# Patient Record
Sex: Male | Born: 1950 | Race: White | Hispanic: No | Marital: Married | Smoking: Former smoker
Health system: Southern US, Community
[De-identification: ages and names within clinical notes are randomized; demographics above are authoritative.]

## PROBLEM LIST (undated history)

## (undated) DIAGNOSIS — E785 Hyperlipidemia, unspecified: Secondary | ICD-10-CM

## (undated) HISTORY — DX: Hyperlipidemia, unspecified: E78.5

---

## 2004-01-22 ENCOUNTER — Encounter: Admission: RE | Admit: 2004-01-22 | Discharge: 2004-01-22 | Payer: Self-pay | Admitting: Family Medicine

## 2004-01-28 ENCOUNTER — Encounter: Admission: RE | Admit: 2004-01-28 | Discharge: 2004-01-28 | Payer: Self-pay | Admitting: Family Medicine

## 2004-02-26 ENCOUNTER — Encounter: Admission: RE | Admit: 2004-02-26 | Discharge: 2004-02-26 | Payer: Self-pay | Admitting: Family Medicine

## 2004-05-12 ENCOUNTER — Encounter: Admission: RE | Admit: 2004-05-12 | Discharge: 2004-05-12 | Payer: Self-pay | Admitting: Sports Medicine

## 2005-03-27 ENCOUNTER — Ambulatory Visit: Payer: Self-pay | Admitting: Family Medicine

## 2005-03-30 ENCOUNTER — Ambulatory Visit: Payer: Self-pay | Admitting: Family Medicine

## 2006-05-18 ENCOUNTER — Ambulatory Visit: Payer: Self-pay | Admitting: Family Medicine

## 2006-05-30 ENCOUNTER — Ambulatory Visit: Payer: Self-pay | Admitting: Family Medicine

## 2006-11-22 DIAGNOSIS — E7800 Pure hypercholesterolemia, unspecified: Secondary | ICD-10-CM

## 2006-11-22 DIAGNOSIS — J309 Allergic rhinitis, unspecified: Secondary | ICD-10-CM | POA: Insufficient documentation

## 2006-11-22 DIAGNOSIS — E781 Pure hyperglyceridemia: Secondary | ICD-10-CM | POA: Insufficient documentation

## 2007-06-10 ENCOUNTER — Ambulatory Visit: Payer: Self-pay | Admitting: Family Medicine

## 2007-06-11 ENCOUNTER — Ambulatory Visit: Payer: Self-pay | Admitting: Family Medicine

## 2007-06-11 LAB — CONVERTED CEMR LAB
AST: 24 units/L (ref 0–37)
BUN: 16 mg/dL (ref 6–23)
CO2: 23 meq/L (ref 19–32)
Calcium: 9.8 mg/dL (ref 8.4–10.5)
Chloride: 102 meq/L (ref 96–112)
Cholesterol: 204 mg/dL — ABNORMAL HIGH (ref 0–200)
Creatinine, Ser: 1.38 mg/dL (ref 0.40–1.50)
HDL: 45 mg/dL (ref >39)
Total Bilirubin: 0.6 mg/dL (ref 0.3–1.2)
Total CHOL/HDL Ratio: 4.5
VLDL: 39 mg/dL (ref 0–40)

## 2007-06-17 ENCOUNTER — Encounter: Payer: Self-pay | Admitting: Family Medicine

## 2007-06-18 ENCOUNTER — Encounter: Payer: Self-pay | Admitting: Family Medicine

## 2007-06-18 LAB — CONVERTED CEMR LAB: OCCULT 3: NEGATIVE

## 2007-06-19 ENCOUNTER — Encounter: Payer: Self-pay | Admitting: Family Medicine

## 2007-06-19 LAB — CONVERTED CEMR LAB: OCCULT 2: NEGATIVE

## 2007-06-20 ENCOUNTER — Encounter: Payer: Self-pay | Admitting: Family Medicine

## 2008-06-29 ENCOUNTER — Ambulatory Visit: Payer: Self-pay | Admitting: Family Medicine

## 2008-07-07 ENCOUNTER — Ambulatory Visit: Payer: Self-pay | Admitting: Family Medicine

## 2008-07-07 ENCOUNTER — Encounter: Payer: Self-pay | Admitting: Family Medicine

## 2008-07-07 LAB — CONVERTED CEMR LAB
ALT: 19 units/L (ref 0–53)
AST: 19 units/L (ref 0–37)
Alkaline Phosphatase: 62 units/L (ref 39–117)
LDL Cholesterol: 104 mg/dL — ABNORMAL HIGH (ref 0–99)
PSA: 1.64 ng/mL (ref 0.10–4.00)
Sodium: 138 meq/L (ref 135–145)
Total Bilirubin: 0.7 mg/dL (ref 0.3–1.2)
Total Protein: 6.4 g/dL (ref 6.0–8.3)
Triglycerides: 228 mg/dL — ABNORMAL HIGH (ref <150)
VLDL: 46 mg/dL — ABNORMAL HIGH (ref 0–40)

## 2009-06-30 ENCOUNTER — Ambulatory Visit: Payer: Self-pay | Admitting: Family Medicine

## 2009-06-30 DIAGNOSIS — N529 Male erectile dysfunction, unspecified: Secondary | ICD-10-CM

## 2009-06-30 HISTORY — DX: Male erectile dysfunction, unspecified: N52.9

## 2009-07-09 ENCOUNTER — Ambulatory Visit: Payer: Self-pay | Admitting: Family Medicine

## 2009-07-09 ENCOUNTER — Encounter: Payer: Self-pay | Admitting: Family Medicine

## 2009-07-09 LAB — CONVERTED CEMR LAB
ALT: 18 units/L (ref 0–53)
AST: 18 units/L (ref 0–37)
Albumin: 4.5 g/dL (ref 3.5–5.2)
Alkaline Phosphatase: 56 units/L (ref 39–117)
BUN: 12 mg/dL (ref 6–23)
HDL: 40 mg/dL (ref >39)
LDL Cholesterol: 116 mg/dL — ABNORMAL HIGH (ref 0–99)
PSA: 1.16 ng/mL (ref 0.10–4.00)
Potassium: 4.2 meq/L (ref 3.5–5.3)
Sodium: 141 meq/L (ref 135–145)

## 2009-07-13 ENCOUNTER — Encounter: Payer: Self-pay | Admitting: Family Medicine

## 2009-07-16 LAB — CONVERTED CEMR LAB: OCCULT 3: NEGATIVE

## 2010-05-03 ENCOUNTER — Encounter: Payer: Self-pay | Admitting: Family Medicine

## 2010-10-25 NOTE — Miscellaneous (Signed)
Summary: Proof of Physical  Physical form dropped off to be filled out.   Please call patient when ready to be picked up. Jonathan Dean  May 03, 2010 4:59 PM form to pcp.Golden Circle RN  May 04, 2010 9:16 AM  Signed and given to Jonathan Dean, triage nurse.  Doralee Albino MD  May 04, 2010 1:53 PM  pt notified & he will pcik up.Golden Circle RN  May 04, 2010 4:29 PM

## 2010-10-27 ENCOUNTER — Encounter: Payer: Self-pay | Admitting: *Deleted

## 2011-09-15 ENCOUNTER — Encounter: Payer: Self-pay | Admitting: Family Medicine

## 2011-09-15 ENCOUNTER — Ambulatory Visit (INDEPENDENT_AMBULATORY_CARE_PROVIDER_SITE_OTHER): Payer: 59 | Admitting: Family Medicine

## 2011-09-15 VITALS — BP 119/75 | HR 69 | Temp 97.7°F | Ht 70.0 in | Wt 174.0 lb

## 2011-09-15 DIAGNOSIS — Z1211 Encounter for screening for malignant neoplasm of colon: Secondary | ICD-10-CM

## 2011-09-15 DIAGNOSIS — D126 Benign neoplasm of colon, unspecified: Secondary | ICD-10-CM

## 2011-09-15 DIAGNOSIS — E7800 Pure hypercholesterolemia, unspecified: Secondary | ICD-10-CM

## 2011-09-15 HISTORY — DX: Benign neoplasm of colon, unspecified: D12.6

## 2011-09-15 LAB — LIPID PANEL
Cholesterol: 253 mg/dL — ABNORMAL HIGH (ref 0–200)
LDL Cholesterol: 181 mg/dL — ABNORMAL HIGH (ref 0–99)
VLDL: 23 mg/dL (ref 0–40)

## 2011-09-15 MED ORDER — ZOSTER VACCINE LIVE 19400 UNT/0.65ML ~~LOC~~ SOLR: 0.6500 mL | Freq: Once | SUBCUTANEOUS | Status: AC

## 2011-09-15 NOTE — Patient Instructions (Addendum)
I strongly recommend colon cancer screening.  My nurse will set you up with a GI referral for colonoscopy. At age 60 you are now recommended the Shingles vaccine (zostavax).  It is expensive $250.  I sent a prescription to the pharmacy.  They can let you know if insurance covers.  Let me know if you receive the vaccine so I can update my records. I will call with results of your blood work and send letter.  I will likely restart your generic lipitor once I have those results.

## 2011-09-15 NOTE — Progress Notes (Signed)
  Subjective:    Patient ID: Jonathan Dean, male    DOB: 1950/12/12, 60 y.o.   MRN: [id number]  HPI Very healthy.  Good health habits with diet and exercise.  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      Review of Systems  Denies CP, SOB, wt change, bleeding     Objective:   Physical Exam HEENT nl Neck supple Lungs clear Cardiac RRR without m Abd benign. Ext no edema        Assessment & Plan:

## 2011-09-15 NOTE — Assessment & Plan Note (Addendum)
Need labs  LDL up, will resume lipitor

## 2011-09-16 LAB — COMPLETE METABOLIC PANEL WITH GFR
ALT: 20 U/L (ref 0–53)
AST: 17 U/L (ref 0–37)
Albumin: 4.3 g/dL (ref 3.5–5.2)
Calcium: 9.5 mg/dL (ref 8.4–10.5)
Chloride: 105 mEq/L (ref 96–112)
Potassium: 4.5 mEq/L (ref 3.5–5.3)

## 2011-09-20 ENCOUNTER — Encounter: Payer: Self-pay | Admitting: Family Medicine

## 2011-09-20 MED ORDER — ATORVASTATIN CALCIUM 20 MG PO TABS: 20.0000 mg | ORAL_TABLET | ORAL | Status: DC

## 2011-09-20 NOTE — Progress Notes (Signed)
Addended by: Tivis Ringer on: 09/20/2011 10:50 AM   Modules accepted: Orders

## 2011-10-03 ENCOUNTER — Telehealth: Payer: Self-pay | Admitting: *Deleted

## 2011-10-03 ENCOUNTER — Encounter: Payer: Self-pay | Admitting: Gastroenterology

## 2011-10-03 NOTE — Telephone Encounter (Signed)
Called and spoke with pt's wife Larita Fife) gave her the following information regarding his appt times and dates 1.  Jan. 29, 2013 @ 1000 am (this is a nurse visit) 2.  Feb. 12, 2013 @ 800 am  (procedure, Dr. Christella Hartigan)  These appts are with [facility] [address] 3rd floor ph. [phone].Loralee Pacas [place]

## 2011-10-24 ENCOUNTER — Ambulatory Visit (AMBULATORY_SURGERY_CENTER): Payer: No Typology Code available for payment source | Admitting: *Deleted

## 2011-10-24 VITALS — Ht 70.0 in | Wt 170.0 lb

## 2011-10-24 DIAGNOSIS — Z1211 Encounter for screening for malignant neoplasm of colon: Secondary | ICD-10-CM

## 2011-10-24 MED ORDER — PEG-KCL-NACL-NASULF-NA ASC-C 100 G PO SOLR: ORAL | Status: DC

## 2011-11-06 NOTE — Progress Notes (Signed)
Addended by: Maple Hudson on: 11/06/2011 12:29 PM   Modules accepted: Level of Service

## 2011-11-07 ENCOUNTER — Ambulatory Visit (AMBULATORY_SURGERY_CENTER): Payer: No Typology Code available for payment source | Admitting: Gastroenterology

## 2011-11-07 ENCOUNTER — Encounter: Payer: Self-pay | Admitting: Gastroenterology

## 2011-11-07 VITALS — BP 114/74 | HR 63 | Temp 97.1°F | Resp 15 | Ht 70.0 in | Wt 170.0 lb

## 2011-11-07 DIAGNOSIS — Z1211 Encounter for screening for malignant neoplasm of colon: Secondary | ICD-10-CM

## 2011-11-07 DIAGNOSIS — D126 Benign neoplasm of colon, unspecified: Secondary | ICD-10-CM

## 2011-11-07 MED ORDER — SODIUM CHLORIDE 0.9 % IV SOLN: 500.0000 mL | INTRAVENOUS | Status: DC

## 2011-11-07 NOTE — Progress Notes (Signed)
Patient did not experience any of the following events: a burn prior to discharge; a fall within the facility; wrong site/side/patient/procedure/implant event; or a hospital transfer or hospital admission upon discharge from the facility. (G8907) Patient did not have preoperative order for IV antibiotic SSI prophylaxis. (G8918)  

## 2011-11-07 NOTE — Patient Instructions (Signed)
Resume medications. Information given on polyps. D/C Information reviewed with family.

## 2011-11-07 NOTE — Op Note (Signed)
[facility] [address] [place], [place]  [address]  COLONOSCOPY PROCEDURE REPORT  PATIENT:  Jonathan Dean, Jonathan Dean  MR#:  [id number] BIRTHDATE:  Jul 10, 1951, 60 yrs. old  GENDER:  male ENDOSCOPIST:  Rachael Fee, MD REF. BY:  Doralee Albino, M.D. PROCEDURE DATE:  11/07/2011 PROCEDURE:  Colonoscopy with snare polypectomy ASA CLASS:  Class II INDICATIONS:  Routine Risk Screening MEDICATIONS:   Fentanyl 75 mcg IV, These medications were titrated to patient response per physician's verbal order, Versed 7 mg IV  DESCRIPTION OF PROCEDURE:   After the risks benefits and alternatives of the procedure were thoroughly explained, informed consent was obtained.  Digital rectal exam was performed and revealed no rectal masses.   The [id number] endoscope was introduced through the anus and advanced to the cecum, which was identified by both the appendix and ileocecal valve, without limitations.  The quality of the prep was good..  The instrument was then slowly withdrawn as the colon was fully examined. <<PROCEDUREIMAGES>> FINDINGS: Five polyps were removed. The largest was 1cm across, broad based, located in sigmoid segment, removed with snare/cautery and sent to pathology (jar 2). The other four were sessile, 2-6mm across, located in transverse, descending and sigmoid segments, all removed with cold snare, three of them were retrieved and sent to pathology (jar 1) (see image3, image5, image6, and image7).  This was otherwise a normal examination of the colon (see image1, image2, and image8).   Retroflexed views in the rectum revealed no abnormalities. completed. COMPLICATIONS:  None  ENDOSCOPIC IMPRESSION: 1) Five polyps found, all were removed, four were retrieved and sent to pathology 2) Otherwise normal examination  RECOMMENDATIONS: 1) If the polyp(s) removed today are proven to be adenomatous (pre-cancerous) polyps, you will need a colonoscopy in 3 years. Otherwise  you should continue to follow colorectal cancer screening guidelines for "routine risk" patients with a colonoscopy in 10 years. 2) You will receive a letter within 1-2 weeks with the results of your biopsy as well as final recommendations. Please call my office if you have not received a letter after 3 weeks.  ______________________________ Rachael Fee, MD  n. eSIGNED:   Rachael Fee at 11/07/2011 09:28 AM  Pearson Grippe, [phone]

## 2011-11-08 ENCOUNTER — Telehealth: Payer: Self-pay | Admitting: *Deleted

## 2011-11-08 NOTE — Telephone Encounter (Signed)
  Follow up Call-  Call back number 11/07/2011  Post procedure Call Back phone  # [phone]-  Permission to leave phone message Yes     Patient questions:  Do you have a fever, pain , or abdominal swelling? no Pain Score  0 *  Have you tolerated food without any problems? yes  Have you been able to return to your normal activities? yes  Do you have any questions about your discharge instructions: Diet   no Medications  no Follow up visit  no  Do you have questions or concerns about your Care? no  Actions: * If pain score is 4 or above: No action needed, pain <4.

## 2011-11-13 ENCOUNTER — Encounter: Payer: Self-pay | Admitting: Family Medicine

## 2011-11-14 ENCOUNTER — Encounter: Payer: Self-pay | Admitting: Gastroenterology

## 2012-10-04 ENCOUNTER — Encounter: Payer: Self-pay | Admitting: Family Medicine

## 2012-10-04 ENCOUNTER — Ambulatory Visit (INDEPENDENT_AMBULATORY_CARE_PROVIDER_SITE_OTHER): Payer: BC Managed Care – PPO | Admitting: Family Medicine

## 2012-10-04 VITALS — BP 112/65 | HR 60 | Ht 69.5 in | Wt 170.0 lb

## 2012-10-04 DIAGNOSIS — Z125 Encounter for screening for malignant neoplasm of prostate: Secondary | ICD-10-CM | POA: Insufficient documentation

## 2012-10-04 DIAGNOSIS — Z23 Encounter for immunization: Secondary | ICD-10-CM

## 2012-10-04 DIAGNOSIS — E781 Pure hyperglyceridemia: Secondary | ICD-10-CM

## 2012-10-04 DIAGNOSIS — Z0000 Encounter for general adult medical examination without abnormal findings: Secondary | ICD-10-CM

## 2012-10-04 DIAGNOSIS — E7800 Pure hypercholesterolemia, unspecified: Secondary | ICD-10-CM

## 2012-10-04 DIAGNOSIS — H409 Unspecified glaucoma: Secondary | ICD-10-CM | POA: Insufficient documentation

## 2012-10-04 DIAGNOSIS — Z136 Encounter for screening for cardiovascular disorders: Secondary | ICD-10-CM

## 2012-10-04 LAB — LIPID PANEL
Cholesterol: 175 mg/dL (ref 0–200)
LDL Cholesterol: 110 mg/dL — ABNORMAL HIGH (ref 0–99)
VLDL: 23 mg/dL (ref 0–40)

## 2012-10-04 LAB — PSA: PSA: 1.86 ng/mL (ref <=4.00)

## 2012-10-04 MED ORDER — ZOSTER VACCINE LIVE 19400 UNT/0.65ML ~~LOC~~ SOLR: 0.6500 mL | Freq: Once | SUBCUTANEOUS | Status: DC

## 2012-10-04 NOTE — Assessment & Plan Note (Signed)
Fasting today, will check labs.

## 2012-10-04 NOTE — Assessment & Plan Note (Signed)
Fasting today, will check labs. 

## 2012-10-04 NOTE — Progress Notes (Signed)
  Subjective:    Patient ID: Jonathan Dean, male    DOB: June 28, 1951, 62 y.o.   MRN: [id number]  HPI  For annual physical.  Feels great.  No complaints.  Is on eye drops for glaucoma but not certain which ones so my list my not be accurate.  Advised by ophthalmology to avoid nasonex because can cause or worsen glaucoma.    Due for lipid panel.  Has had nothing but black coffee this morning.  Last CMP showed creat creeping up, needs recheck.  No flu shot yet but wants one. Has not had shingles vaccine.  Will order.  Also is a former smoker and so is a candidate for AAA screening.    Review of Systems Denies CP, SOB, wt change, bleeding, change in bowel or bladder.     Objective:   Physical ExamHEENT nl Neck supple without masses Lungs clear Cardiac RRR without m or g Abd benign Ext no edema Neuro motor and sensory grossly intact.        Assessment & Plan:

## 2012-10-04 NOTE — Assessment & Plan Note (Signed)
Discussed PSA screen.  Since under 65 and very healthy, will screen again.  No risk factors.

## 2012-10-04 NOTE — Patient Instructions (Addendum)
I sent a prescription for the shingles vaccine to the pharmacy I will call with cholesterol results and other tests.  I will wait to refill your atorvasatin until we see the test results.   Keep up the good work.    You have been scheduled for an appointment for Aortic ultrasound at [facility] on Friday- 10/18/12 at 7:30 am, please arrive at Radiology on the 1st floor at 7:15am that morning.

## 2012-10-05 LAB — COMPLETE METABOLIC PANEL WITH GFR
ALT: 16 U/L (ref 0–53)
AST: 15 U/L (ref 0–37)
Albumin: 4.4 g/dL (ref 3.5–5.2)
Alkaline Phosphatase: 57 U/L (ref 39–117)
Calcium: 9.7 mg/dL (ref 8.4–10.5)
Chloride: 104 mEq/L (ref 96–112)
Potassium: 4.2 mEq/L (ref 3.5–5.3)
Sodium: 138 mEq/L (ref 135–145)
Total Protein: 6.6 g/dL (ref 6.0–8.3)

## 2012-10-10 ENCOUNTER — Encounter: Payer: Self-pay | Admitting: Family Medicine

## 2012-10-16 ENCOUNTER — Telehealth: Payer: Self-pay | Admitting: Family Medicine

## 2012-10-16 NOTE — Telephone Encounter (Signed)
Has a procedure this Friday and needs to discuss this with nurse first - pls call today

## 2012-10-18 ENCOUNTER — Ambulatory Visit (HOSPITAL_COMMUNITY): Payer: BC Managed Care – PPO

## 2013-01-27 ENCOUNTER — Other Ambulatory Visit: Payer: Self-pay | Admitting: *Deleted

## 2013-01-27 DIAGNOSIS — E7800 Pure hypercholesterolemia, unspecified: Secondary | ICD-10-CM

## 2013-01-27 MED ORDER — ATORVASTATIN CALCIUM 20 MG PO TABS: 20.0000 mg | ORAL_TABLET | ORAL | Status: DC

## 2013-03-20 ENCOUNTER — Other Ambulatory Visit: Payer: Self-pay | Admitting: *Deleted

## 2013-03-20 DIAGNOSIS — E7800 Pure hypercholesterolemia, unspecified: Secondary | ICD-10-CM

## 2013-03-20 MED ORDER — ATORVASTATIN CALCIUM 20 MG PO TABS: 20.0000 mg | ORAL_TABLET | ORAL | Status: DC

## 2014-04-24 ENCOUNTER — Encounter: Payer: BC Managed Care – PPO | Admitting: Family Medicine

## 2014-11-13 ENCOUNTER — Encounter: Payer: Self-pay | Admitting: Gastroenterology

## 2015-03-14 DIAGNOSIS — H401113 Primary open-angle glaucoma, right eye, severe stage: Secondary | ICD-10-CM | POA: Insufficient documentation

## 2015-03-16 ENCOUNTER — Telehealth: Payer: Self-pay | Admitting: Family Medicine

## 2015-03-16 DIAGNOSIS — H409 Unspecified glaucoma: Secondary | ICD-10-CM

## 2015-03-16 NOTE — Telephone Encounter (Signed)
Needs referral to dr j. Ruby Cola  bond at [facility] Was seen there on 02-23-15 and has another appt 05-19-15 He is seen there for his glaucoma Phone: [phone] ( wife didn't have all the information needed)

## 2015-03-16 NOTE — Telephone Encounter (Signed)
Dr. Edilia Bo is an ophthalmologist in [place] . Jonathan Dean, Jonathan Dean

## 2015-03-16 NOTE — Telephone Encounter (Signed)
Pt has not been in our office in almost 3 years. We do not have updated insurance info on file. LMOVM to ask patient about what type of insurance he had to determine if he indeed needs a referral.

## 2020-07-19 ENCOUNTER — Ambulatory Visit: Payer: Self-pay | Admitting: Internal Medicine

## 2020-07-20 ENCOUNTER — Ambulatory Visit: Payer: Self-pay | Admitting: Internal Medicine

## 2020-07-26 ENCOUNTER — Encounter (INDEPENDENT_AMBULATORY_CARE_PROVIDER_SITE_OTHER): Payer: Self-pay | Admitting: *Deleted

## 2020-07-26 ENCOUNTER — Other Ambulatory Visit: Payer: Self-pay

## 2020-07-26 ENCOUNTER — Encounter: Payer: Self-pay | Admitting: Internal Medicine

## 2020-07-26 ENCOUNTER — Ambulatory Visit (INDEPENDENT_AMBULATORY_CARE_PROVIDER_SITE_OTHER): Payer: No Typology Code available for payment source | Admitting: Internal Medicine

## 2020-07-26 VITALS — BP 137/83 | HR 70 | Ht 70.0 in | Wt 179.0 lb

## 2020-07-26 DIAGNOSIS — H401130 Primary open-angle glaucoma, bilateral, stage unspecified: Secondary | ICD-10-CM | POA: Diagnosis not present

## 2020-07-26 DIAGNOSIS — Z125 Encounter for screening for malignant neoplasm of prostate: Secondary | ICD-10-CM | POA: Diagnosis not present

## 2020-07-26 DIAGNOSIS — Z7689 Persons encountering health services in other specified circumstances: Secondary | ICD-10-CM | POA: Diagnosis not present

## 2020-07-26 DIAGNOSIS — D126 Benign neoplasm of colon, unspecified: Secondary | ICD-10-CM | POA: Diagnosis not present

## 2020-07-26 DIAGNOSIS — Z23 Encounter for immunization: Secondary | ICD-10-CM | POA: Diagnosis not present

## 2020-07-26 NOTE — Assessment & Plan Note (Signed)
Removed in 2013 Has not had follow up with GI No active complaints like abdominal pain, constipation, diarrhea, melena or hematochezia Referred to GI for follow-up

## 2020-07-26 NOTE — Assessment & Plan Note (Signed)
Follows up with ophthalmologist On Cosopt eye drops

## 2020-07-26 NOTE — Patient Instructions (Addendum)
Please schedule an appointment after 4 months for annual physical.  You were given Pneumonia vaccine (PCV12) in the office. Second dose after 1 year.  Please continue to follow heart healthy diet - consisting of green vegetables and fruits.  Please perform moderate exercise/walking at least 150 mins/week.  You are being referred to GI for screening colonoscopy.

## 2020-07-26 NOTE — Progress Notes (Signed)
New Patient Office Visit  Subjective:  Patient ID: Jonathan Dean, male    DOB: [birth year]  Age: 69 y.o. MRN: [id number]  CC:  Chief Complaint  Patient presents with   New Patient (Initial Visit)    here to establish care, no complaints today    HPI Jonathan Dean is a 69 year old male with past medical history of hyperlipidemia, glaucoma and colon polyps presents for establishing care.  Patient states that he has been in good health overall.  Patient uses eyedrops for his glaucoma and follows up with ophthalmologist, last visit at this morning.  Patient was taking Lipitor for hyperlipidemia in the past.  But it was discontinued after improvement in his lipid profile.  Patient denies chest pain, dyspnea or palpitations.  Patient had last colonoscopy in 10/2011, which showed tubular adenoma and tubulovillous polyp.  Patient has not had followed up with GI since then.  Patient denies constipation, diarrhea, melena or hematochezia.  Patient quit smoking in 2006.  Patient has not had ultrasound of the abdomen for AAA screening, patient wants to wait for now.  Patient has had both doses of cold vaccination and flu vaccine.  Patient received PCV13 in the office today.  Patient was advised to take shingles vaccine from the pharmacy.  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      History reviewed. No pertinent surgical history.  History reviewed. No pertinent family history.  Social History   Socioeconomic History   Marital status: Married    Spouse name: Not on file   Number of children: Not on file   Years of education: Not on file   Highest education level: Not on file  Occupational History   Not on file  Tobacco Use    Smoking status: Former Smoker    Years: 25.00    Types: Cigarettes    Quit date: 09/14/2005    Years since quitting: 14.8   Smokeless tobacco: Never Used  Substance and Sexual Activity   Alcohol use: Yes    Alcohol/week: 2.0 standard drinks    Types: 2 drink(s) per week   Drug use: No   Sexual activity: Yes    Partners: Female    Comment: monogamous  Other Topics Concern   Not on file  Social History Narrative   Not on file   Social Determinants of Health   Financial Resource Strain:    Difficulty of Paying Living Expenses: Not on file  Food Insecurity:    Worried About Charity fundraiser in the Last Year: Not on file   [employer] of Food in the Last Year: Not on file  Transportation Needs:    Lack of Transportation (Medical): Not on file   Lack of Transportation (Non-Medical): Not on file  Physical Activity:    Days of Exercise per Week: Not on file   Minutes of Exercise per Session: Not on file  Stress:    Feeling of Stress : Not on file  Social Connections:    Frequency of Communication with Friends and Family: Not on file   Frequency of Social Gatherings with Friends and Family: Not on file   Attends Religious Services: Not on file   Active Member of Clubs  or Organizations: Not on file   Attends Archivist Meetings: Not on file   Marital Status: Not on file  Intimate Partner Violence:    Fear of Current or Ex-Partner: Not on file   Emotionally Abused: Not on file   Physically Abused: Not on file   Sexually Abused: Not on file    ROS Review of Systems  Constitutional: Negative for chills and fever.  HENT: Negative for congestion and sore throat.   Eyes: Negative for pain and discharge.  Respiratory: Negative for cough and shortness of breath.   Cardiovascular: Negative for chest pain and palpitations.  Gastrointestinal: Negative for constipation, diarrhea, nausea and vomiting.  Endocrine: Negative for polydipsia and  polyuria.  Genitourinary: Negative for dysuria and hematuria.  Musculoskeletal: Negative for neck pain and neck stiffness.  Skin: Negative for rash.  Neurological: Negative for dizziness, weakness, numbness and headaches.  Psychiatric/Behavioral: Negative for agitation and behavioral problems.    Objective:   Today's Vitals: BP 137/83 (BP Location: Right Arm, Patient Position: Sitting, Cuff Size: Normal)    Pulse 70    Ht _0  (1.778 m)    Wt 179 lb (81.2 kg)    SpO2 98%    BMI 25.68 kg/m   Physical Exam Vitals reviewed.  Constitutional:      General: He is not in acute distress.    Appearance: He is not diaphoretic.  HENT:     Head: Normocephalic and atraumatic.     Nose: Nose normal.     Mouth/Throat:     Mouth: Mucous membranes are moist.  Eyes:     General: No scleral icterus.    Extraocular Movements: Extraocular movements intact.     Pupils: Pupils are equal, round, and reactive to light.  Cardiovascular:     Rate and Rhythm: Normal rate and regular rhythm.     Pulses: Normal pulses.     Heart sounds: Normal heart sounds. No murmur heard.   Pulmonary:     Breath sounds: Normal breath sounds. No wheezing or rales.  Abdominal:     Palpations: Abdomen is soft.     Tenderness: There is no abdominal tenderness.  Musculoskeletal:     Cervical back: Neck supple. No tenderness.     Right lower leg: No edema.     Left lower leg: No edema.  Skin:    General: Skin is warm.     Comments: Deep pigmented patches over dorsal aspect of bilateral hands and also on anterior aspect of the legs  Neurological:     General: No focal deficit present.     Mental Status: He is alert and oriented to person, place, and time.     Sensory: No sensory deficit.     Motor: No weakness.  Psychiatric:        Mood and Affect: Mood normal.        Behavior: Behavior normal.     Assessment & Plan:   Problem List Items Addressed This Visit      Encounter to establish care - Primary    Care established Previous chart reviewed History and medications reviewed with the patient     Relevant Orders  CBC with Differential  CMP14+EGFR  Lipid Profile  Vitamin D (25 hydroxy)    Digestive   Tubular adenoma of colon   Tubulovillous adenoma polyp of colon    Removed in 2013 Has not had follow up with GI No active complaints like abdominal pain, constipation, diarrhea, melena or  hematochezia Referred to GI for follow-up      Relevant Orders   Ambulatory referral to Gastroenterology     Other   Glaucoma    Follows up with ophthalmologist On Cosopt eye drops      Prostate cancer screening    No active complaints.  PSA screening discussed -PSA ordered.      Relevant Orders   PSA      Outpatient Encounter Medications as of 07/26/2020  Medication Sig   dorzolamide-timolol (COSOPT) 22.3-6.8 MG/ML ophthalmic solution    latanoprost (XALATAN) 0.005 % ophthalmic solution    Multiple Vitamins-Minerals (MULTIVITAMIN WITH MINERALS) tablet Take 1 tablet by mouth as needed.   [DISCONTINUED] Ascorbic Acid (VITAMIN C) 1000 MG tablet Take 1,000 mg by mouth daily. (Patient not taking: Reported on 07/26/2020)   [DISCONTINUED] aspirin 325 MG tablet Take 325 mg by mouth daily.   (Patient not taking: Reported on 07/26/2020)   [DISCONTINUED] atorvastatin (LIPITOR) 20 MG tablet Take 1 tablet (20 mg total) by mouth as directed. (Patient not taking: Reported on 07/26/2020)   [DISCONTINUED] B Complex CAPS Take 1 capsule by mouth daily. (Patient not taking: Reported on 07/26/2020)   [DISCONTINUED] LOTEMAX 0.5 % GEL  (Patient not taking: Reported on 07/26/2020)   [DISCONTINUED] LUMIGAN 0.01 % SOLN  (Patient not taking: Reported on 07/26/2020)   [DISCONTINUED] Potassium 99 MG TABS Take 1 tablet by mouth daily. (Patient not taking: Reported on 07/26/2020)   [DISCONTINUED] timolol (BETIMOL) 0.25 % ophthalmic solution Place 1-2 drops into both eyes 2 (two) times daily. (Patient not  taking: Reported on 07/26/2020)   [DISCONTINUED] vitamin E 400 UNIT capsule Take 400 Units by mouth daily. (Patient not taking: Reported on 07/26/2020)   [DISCONTINUED] zoster vaccine live, PF, (ZOSTAVAX) 19471 UNT/0.65ML injection Inject 19,400 Units into the skin once. (Patient not taking: Reported on 07/26/2020)   No facility-administered encounter medications on file as of 07/26/2020.    Follow-up: Return in about 4 months (around 11/23/2020).   Lindell Spar, MD

## 2020-07-26 NOTE — Assessment & Plan Note (Signed)
No active complaints.  PSA screening discussed -PSA ordered.

## 2020-07-26 NOTE — Addendum Note (Signed)
Addended by: Laretta Bolster on: 07/26/2020 02:10 PM   Modules accepted: Orders

## 2020-07-26 NOTE — Assessment & Plan Note (Signed)
Care established Previous chart reviewed History and medications reviewed with the patient 

## 2020-09-14 ENCOUNTER — Other Ambulatory Visit: Payer: Self-pay

## 2020-09-14 ENCOUNTER — Other Ambulatory Visit: Payer: No Typology Code available for payment source

## 2020-09-14 DIAGNOSIS — Z20822 Contact with and (suspected) exposure to covid-19: Secondary | ICD-10-CM

## 2020-09-16 LAB — NOVEL CORONAVIRUS, NAA: SARS-CoV-2, NAA: NOT DETECTED

## 2020-09-16 LAB — SARS-COV-2, NAA 2 DAY TAT

## 2020-09-16 LAB — SPECIMEN STATUS REPORT

## 2020-09-20 ENCOUNTER — Ambulatory Visit: Payer: No Typology Code available for payment source | Admitting: Nurse Practitioner

## 2020-09-20 ENCOUNTER — Encounter: Payer: Self-pay | Admitting: Nurse Practitioner

## 2020-09-20 ENCOUNTER — Other Ambulatory Visit: Payer: Self-pay

## 2020-09-20 VITALS — BP 156/94 | HR 71 | Temp 98.6°F | Resp 18 | Ht 70.0 in | Wt 167.0 lb

## 2020-09-20 DIAGNOSIS — J40 Bronchitis, not specified as acute or chronic: Secondary | ICD-10-CM | POA: Diagnosis not present

## 2020-09-20 MED ORDER — AMOXICILLIN-POT CLAVULANATE 875-125 MG PO TABS: 1.0000 | ORAL_TABLET | Freq: Two times a day (BID) | ORAL | 0 refills | Status: DC

## 2020-09-20 MED ORDER — ALBUTEROL SULFATE HFA 108 (90 BASE) MCG/ACT IN AERS: 2.0000 | INHALATION_SPRAY | Freq: Four times a day (QID) | RESPIRATORY_TRACT | 0 refills | Status: DC | PRN

## 2020-09-20 NOTE — Assessment & Plan Note (Signed)
-  feels SOB with exertion and has been ongoing for a week -Rx. Albuterol -Rx. augmentin -f/u in a week if symptoms persist

## 2020-09-20 NOTE — Progress Notes (Signed)
Acute Office Visit  Subjective:    Patient ID: Jonathan Dean, male    DOB: January 11, 1951, 69 y.o.   MRN: [id number]  Chief Complaint  Patient presents with  . Cough    X 1 week  . Shortness of Breath    X 1 week   . Fatigue    Weakness     HPI Patient is in today for SOB, productive cough, and fatigue for about a week.  He states he has SOB with exertion. Denies fever, sinus pain/pressure, He had COVID test last Tuesday, and this was negative. He has been using ibuprofen and mucinex and Vicks vaporub.  Past Medical History:  Diagnosis Date  . ERECTILE DYSFUNCTION 06/30/2009   Qualifier: Diagnosis of  By: Andria Frames MD, Gwyndolyn Saxon    . Glaucoma   . Hyperlipidemia   . Polyp of colon, adenomatous 09/15/2011   Colonoscopy 11/07/11 removed at least two adenomatous polyps confirmed by surgical path.      No past surgical history on file.  No family history on file.  Social History   Socioeconomic History  . Marital status: Married    Spouse name: Not on file  . Number of children: Not on file  . Years of education: Not on file  . Highest education level: Not on file  Occupational History  . Not on file  Tobacco Use  . Smoking status: Former Smoker    Years: 25.00    Types: Cigarettes    Quit date: 09/14/2005    Years since quitting: 15.0  . Smokeless tobacco: Never Used  Substance and Sexual Activity  . Alcohol use: Yes    Alcohol/week: 2.0 standard drinks    Types: 2 drink(s) per week  . Drug use: No  . Sexual activity: Yes    Partners: Female    Comment: monogamous  Other Topics Concern  . Not on file  Social History Narrative  . Not on file   Social Determinants of Health   Financial Resource Strain: Not on file  Food Insecurity: Not on file  Transportation Needs: Not on file  Physical Activity: Not on file  Stress: Not on file  Social Connections: Not on file  Intimate Partner Violence: Not on file    Outpatient Medications Prior to Visit   Medication Sig Dispense Refill  . dorzolamide-timolol (COSOPT) 22.3-6.8 MG/ML ophthalmic solution     . latanoprost (XALATAN) 0.005 % ophthalmic solution     . Multiple Vitamins-Minerals (MULTIVITAMIN WITH MINERALS) tablet Take 1 tablet by mouth as needed.     No facility-administered medications prior to visit.    No Known Allergies  Review of Systems  Constitutional: Negative.   HENT: Negative for congestion, sinus pressure, sinus pain and sore throat.   Respiratory: Positive for cough, chest tightness, shortness of breath and wheezing.   Cardiovascular: Negative.        Objective:    Physical Exam Constitutional:      Appearance: He is well-developed.  Cardiovascular:     Rate and Rhythm: Normal rate and regular rhythm.  Pulmonary:     Effort: Pulmonary effort is normal.  Neurological:     Mental Status: He is alert.     BP (!) 156/94   Pulse 71   Temp 98.6 F (37 C)   Resp 18   Ht 5\' 10"  (1.778 m)   Wt 167 lb (75.8 kg)   SpO2 96%   BMI 23.96 kg/m  Wt Readings from Last 3 Encounters:  09/20/20 167 lb (75.8 kg)  07/26/20 179 lb (81.2 kg)  10/04/12 170 lb (77.1 kg)    Health Maintenance Due  Topic Date Due  . Hepatitis C Screening  Never done  . COVID-19 Vaccine (1) Never done  . COLONOSCOPY (Pts 45-75yrs Insurance coverage will need to be confirmed)  11/06/2016  . TETANUS/TDAP  06/29/2018    There are no preventive care reminders to display for this patient.   No results found for: TSH No results found for: WBC, HGB, HCT, MCV, PLT Lab Results  Component Value Date   NA 138 10/04/2012   K 4.2 10/04/2012   CO2 28 10/04/2012   GLUCOSE 95 10/04/2012   BUN 15 10/04/2012   CREATININE 1.30 10/04/2012   BILITOT 1.1 10/04/2012   ALKPHOS 57 10/04/2012   AST 15 10/04/2012   ALT 16 10/04/2012   PROT 6.6 10/04/2012   ALBUMIN 4.4 10/04/2012   CALCIUM 9.7 10/04/2012   Lab Results  Component Value Date   CHOL 175 10/04/2012   Lab Results   Component Value Date   HDL 42 10/04/2012   Lab Results  Component Value Date   LDLCALC 110 (H) 10/04/2012   Lab Results  Component Value Date   TRIG 116 10/04/2012   Lab Results  Component Value Date   CHOLHDL 4.2 10/04/2012   No results found for: HGBA1C     Assessment & Plan:   Problem List Items Addressed This Visit   None      No orders of the defined types were placed in this encounter.    Heather Roberts, NP

## 2020-09-30 ENCOUNTER — Telehealth (INDEPENDENT_AMBULATORY_CARE_PROVIDER_SITE_OTHER): Payer: PPO | Admitting: Internal Medicine

## 2020-09-30 ENCOUNTER — Ambulatory Visit (HOSPITAL_COMMUNITY)
Admission: RE | Admit: 2020-09-30 | Discharge: 2020-09-30 | Disposition: A | Discharge status: Home / Self Care | Payer: PPO | Source: Ambulatory Visit | Attending: Internal Medicine | Admitting: Internal Medicine

## 2020-09-30 ENCOUNTER — Other Ambulatory Visit: Payer: Self-pay

## 2020-09-30 ENCOUNTER — Encounter: Payer: Self-pay | Admitting: Internal Medicine

## 2020-09-30 DIAGNOSIS — J40 Bronchitis, not specified as acute or chronic: Secondary | ICD-10-CM | POA: Insufficient documentation

## 2020-09-30 DIAGNOSIS — R630 Anorexia: Secondary | ICD-10-CM

## 2020-09-30 DIAGNOSIS — R0600 Dyspnea, unspecified: Secondary | ICD-10-CM | POA: Diagnosis not present

## 2020-09-30 NOTE — Patient Instructions (Signed)
Please get chest X-ray done soon at [facility].  Please use Mucinex or Robitussin for cough.  Please continue to use Albuterol inhaler for shortness of breath/wheezing.

## 2020-09-30 NOTE — Progress Notes (Signed)
Virtual Visit via Telephone Note   This visit type was conducted due to national recommendations for restrictions regarding the COVID-19 Pandemic (e.g. social distancing) in an effort to limit this patient's exposure and mitigate transmission in our community.  Due to his co-morbid illnesses, this patient is at least at moderate risk for complications without adequate follow up.  This format is felt to be most appropriate for this patient at this time.  The patient did not have access to video technology/had technical difficulties with video requiring transitioning to audio format only (telephone).  All issues noted in this document were discussed and addressed.  No physical exam could be performed with this format.  Evaluation Performed:  Follow-up visit  Date:  09/30/2020   ID:  WILMONT LABARCA, DOB 05-Aug-1951, MRN [id number]  Patient Location: Home Provider Location: Office/Clinic  Participants: Patient Location of Patient: Home Location of Provider: Telehealth Consent was obtain for visit to be over via telehealth. I verified that I am speaking with the correct person using two identifiers.  PCP:  Lindell Spar, MD   Chief Complaint:  Cough and loss of appetite  History of Present Illness:    Jonathan Dean is a 70 y.o. male with past medical history of hyperlipidemia, glaucoma and colon polyps  who has a televisit for complaint of cough and loss of appetite.  Patient was prescribed Augmentin for likely bronchitis.  His Covid test was negative before the previous visit.  He denies any fever, chills, nasal congestion currently.  He continues to have mild cough which is better with Mucinex.  He has been having some dyspnea, which is worse with exertion and better with albuterol inhaler.  He also reports loss of appetite and has lost about 11 lb in 2 weeks.  He denies any night sweats, chest pain or hemoptysis.  The patient does not have symptoms concerning for COVID-19  infection (fever, chills, cough, or new shortness of breath).   Past Medical, Surgical, Social History, Allergies, and Medications have been Reviewed.  Past Medical History:  Diagnosis Date  . ERECTILE DYSFUNCTION 06/30/2009   Qualifier: Diagnosis of  By: Andria Frames MD, Gwyndolyn Saxon    . Glaucoma   . Hyperlipidemia   . Polyp of colon, adenomatous 09/15/2011   Colonoscopy 11/07/11 removed at least two adenomatous polyps confirmed by surgical path.     History reviewed. No pertinent surgical history.   Current Meds  Medication Sig  . albuterol (VENTOLIN HFA) 108 (90 Base) MCG/ACT inhaler Inhale 2 puffs into the lungs every 6 (six) hours as needed for wheezing or shortness of breath.  Marland Kitchen amoxicillin-clavulanate (AUGMENTIN) 875-125 MG tablet Take 1 tablet by mouth 2 (two) times daily.  . dorzolamide-timolol (COSOPT) 22.3-6.8 MG/ML ophthalmic solution   . latanoprost (XALATAN) 0.005 % ophthalmic solution   . Multiple Vitamins-Minerals (MULTIVITAMIN WITH MINERALS) tablet Take 1 tablet by mouth as needed.     Allergies:   Dorzolamide hcl-timolol mal   ROS:   Please see the history of present illness.     All other systems reviewed and are negative.   Labs/Other Tests and Data Reviewed:    Recent Labs: No results found for requested labs within last 8760 hours.   Recent Lipid Panel Lab Results  Component Value Date/Time   CHOL 175 10/04/2012 09:25 AM   TRIG 116 10/04/2012 09:25 AM   HDL 42 10/04/2012 09:25 AM   CHOLHDL 4.2 10/04/2012 09:25 AM   LDLCALC 110 (H) 10/04/2012 09:25  AM    Wt Readings from Last 3 Encounters:  09/20/20 167 lb (75.8 kg)  07/26/20 179 lb (81.2 kg)  10/04/12 170 lb (77.1 kg)     ASSESSMENT & PLAN:    Acute bronchitis S/p Augmentin Persistent cough, likely postinfectious in etiology Dyspnea better with albuterol inhaler Check chest x-ray to rule out any consolidation If persistent weight loss and dry cough, plan to check QuantiFERON gold Advised to  increase water intake and continue to take food as tolerated.  Time:   Today, I have spent 16 minutes reviewing the chart, including problem list, medications, and with the patient with telehealth technology discussing the above problems.   Medication Adjustments/Labs and Tests Ordered: Current medicines are reviewed at length with the patient today.  Concerns regarding medicines are outlined above.   Tests Ordered: Orders Placed This Encounter  Procedures  . DG Chest 2 View    Medication Changes: No orders of the defined types were placed in this encounter.    Note: This dictation was prepared with Dragon dictation along with smaller phrase technology. Similar sounding words can be transcribed inadequately or may not be corrected upon review. Any transcriptional errors that result from this process are unintentional.      Disposition:  Follow up  Signed, Anabel Halon, MD  09/30/2020 9:34 AM     [facility]

## 2020-10-04 ENCOUNTER — Telehealth (INDEPENDENT_AMBULATORY_CARE_PROVIDER_SITE_OTHER): Payer: PPO | Admitting: Internal Medicine

## 2020-10-04 ENCOUNTER — Encounter: Payer: Self-pay | Admitting: Internal Medicine

## 2020-10-04 ENCOUNTER — Other Ambulatory Visit: Payer: Self-pay

## 2020-10-04 DIAGNOSIS — J40 Bronchitis, not specified as acute or chronic: Secondary | ICD-10-CM

## 2020-10-04 MED ORDER — ALBUTEROL SULFATE HFA 108 (90 BASE) MCG/ACT IN AERS: 2.0000 | INHALATION_SPRAY | Freq: Four times a day (QID) | RESPIRATORY_TRACT | 0 refills | Status: DC | PRN

## 2020-10-04 MED ORDER — PREDNISONE 10 MG PO TABS: ORAL_TABLET | ORAL | 0 refills | Status: AC

## 2020-10-04 NOTE — Progress Notes (Signed)
Virtual Visit via Telephone Note   This visit type was conducted due to national recommendations for restrictions regarding the COVID-19 Pandemic (e.g. social distancing) in an effort to limit this patient's exposure and mitigate transmission in our community.  Due to his co-morbid illnesses, this patient is at least at moderate risk for complications without adequate follow up.  This format is felt to be most appropriate for this patient at this time.  The patient did not have access to video technology/had technical difficulties with video requiring transitioning to audio format only (telephone).  All issues noted in this document were discussed and addressed.  No physical exam could be performed with this format.  Evaluation Performed:  Follow-up visit  Date:  10/04/2020   ID:  Jonathan Dean, Jonathan Dean 02-20-1951, MRN [id number]  Patient Location: Home Provider Location: Office/Clinic  Participants: Patient Location of Patient: Home Location of Provider: Telehealth Consent was obtain for visit to be over via telehealth. I verified that I am speaking with the correct person using two identifiers.  PCP:  Lindell Spar, MD   Chief Complaint:  Chest tightness  History of Present Illness:    Jonathan Dean is a 70 y.o. male with  past medical history of hyperlipidemia, glaucoma and colon polyps who has a televisit for complaint of cough and chest tightness.  Patient has completed Augmentin for likely pneumonia. CXR was obtained in the previous visit, which showed inflammatory bronchitic changes. He denies any fever, chills, nausea, vomiting or diarrhea. He has been having dyspnea and chest tightness, for which he uses Albuterol inhaler. He also has episodes of chest tightness at night after few hours of sleep.  He denies any night sweats, chest pain, palpitations or hemoptysis.  The patient does not have symptoms concerning for COVID-19 infection (fever, chills, cough, or new  shortness of breath).   Past Medical, Surgical, Social History, Allergies, and Medications have been Reviewed.  Past Medical History:  Diagnosis Date  . ERECTILE DYSFUNCTION 06/30/2009   Qualifier: Diagnosis of  By: Andria Frames MD, Gwyndolyn Saxon    . Glaucoma   . Hyperlipidemia   . Polyp of colon, adenomatous 09/15/2011   Colonoscopy 11/07/11 removed at least two adenomatous polyps confirmed by surgical path.     History reviewed. No pertinent surgical history.   Current Meds  Medication Sig  . dorzolamide-timolol (COSOPT) 22.3-6.8 MG/ML ophthalmic solution   . latanoprost (XALATAN) 0.005 % ophthalmic solution   . Multiple Vitamins-Minerals (MULTIVITAMIN WITH MINERALS) tablet Take 1 tablet by mouth as needed.  . predniSONE (DELTASONE) 10 MG tablet Take 2 tablets (20 mg total) by mouth daily with breakfast for 5 days, THEN 1 tablet (10 mg total) daily with breakfast for 5 days, THEN 0.5 tablets (5 mg total) daily with breakfast for 5 days.  . [DISCONTINUED] albuterol (VENTOLIN HFA) 108 (90 Base) MCG/ACT inhaler Inhale 2 puffs into the lungs every 6 (six) hours as needed for wheezing or shortness of breath.     Allergies:   Dorzolamide hcl-timolol mal   ROS:   Please see the history of present illness.    Review of Systems  Constitutional: Negative for chills and fever.  HENT: Negative for congestion, sinus pain and sore throat.   Respiratory: Positive for cough, shortness of breath and wheezing.   Cardiovascular: Negative for chest pain and palpitations.  Gastrointestinal: Negative for constipation, diarrhea, nausea and vomiting.  Genitourinary: Negative for dysuria and hematuria.  Musculoskeletal: Negative for neck pain.  Neurological:  Negative for dizziness and focal weakness.     Labs/Other Tests and Data Reviewed:    Recent Labs: No results found for requested labs within last 8760 hours.   Recent Lipid Panel Lab Results  Component Value Date/Time   CHOL 175 10/04/2012 09:25 AM    TRIG 116 10/04/2012 09:25 AM   HDL 42 10/04/2012 09:25 AM   CHOLHDL 4.2 10/04/2012 09:25 AM   LDLCALC 110 (H) 10/04/2012 09:25 AM    Wt Readings from Last 3 Encounters:  09/20/20 167 lb (75.8 kg)  07/26/20 179 lb (81.2 kg)  10/04/12 170 lb (77.1 kg)      ASSESSMENT & PLAN:    Bronchitis Persistent dyspnea and chest tightness after treatment with antibiotics CXR reviewed Prescribed Prednisone taper Albuterol inhaler PRN If persistent symptoms, plan to refer to Pulmonology for PFT  Time:   Today, I have spent 22 minutes reviewing the chart, including problem list, medications, and with the patient with telehealth technology discussing the above problems.   Medication Adjustments/Labs and Tests Ordered: Current medicines are reviewed at length with the patient today.  Concerns regarding medicines are outlined above.   Tests Ordered: No orders of the defined types were placed in this encounter.   Medication Changes: Meds ordered this encounter  Medications  . predniSONE (DELTASONE) 10 MG tablet    Sig: Take 2 tablets (20 mg total) by mouth daily with breakfast for 5 days, THEN 1 tablet (10 mg total) daily with breakfast for 5 days, THEN 0.5 tablets (5 mg total) daily with breakfast for 5 days.    Dispense:  18 tablet    Refill:  0  . albuterol (VENTOLIN HFA) 108 (90 Base) MCG/ACT inhaler    Sig: Inhale 2 puffs into the lungs every 6 (six) hours as needed for wheezing or shortness of breath.    Dispense:  8 g    Refill:  0     Note: This dictation was prepared with Dragon dictation along with smaller phrase technology. Similar sounding words can be transcribed inadequately or may not be corrected upon review. Any transcriptional errors that result from this process are unintentional.      Disposition:  Follow up  Signed, Lindell Spar, MD  10/04/2020 9:40 AM     [place]

## 2020-10-04 NOTE — Patient Instructions (Signed)
Please start taking Prednisone as prescribed.  Please continue to use Albuterol inhaler as prescribed for shortness of breath/wheezing.  If you have persistent shortness of breath despite using Albuterol inhaler, or chest pain or palpitations, please go to ER immediately.

## 2020-10-06 ENCOUNTER — Ambulatory Visit: Payer: No Typology Code available for payment source | Admitting: Internal Medicine

## 2020-10-20 ENCOUNTER — Encounter: Payer: Self-pay | Admitting: Internal Medicine

## 2020-10-20 ENCOUNTER — Ambulatory Visit (INDEPENDENT_AMBULATORY_CARE_PROVIDER_SITE_OTHER): Payer: No Typology Code available for payment source | Admitting: Internal Medicine

## 2020-10-20 ENCOUNTER — Other Ambulatory Visit: Payer: Self-pay

## 2020-10-20 ENCOUNTER — Other Ambulatory Visit: Payer: Self-pay | Admitting: *Deleted

## 2020-10-20 VITALS — BP 161/93 | HR 73 | Temp 98.1°F | Resp 18 | Ht 70.0 in | Wt 164.8 lb

## 2020-10-20 DIAGNOSIS — R030 Elevated blood-pressure reading, without diagnosis of hypertension: Secondary | ICD-10-CM | POA: Diagnosis not present

## 2020-10-20 DIAGNOSIS — J40 Bronchitis, not specified as acute or chronic: Secondary | ICD-10-CM

## 2020-10-20 DIAGNOSIS — R053 Chronic cough: Secondary | ICD-10-CM

## 2020-10-20 MED ORDER — BREO ELLIPTA 100-25 MCG/INH IN AEPB
1.0000 | INHALATION_SPRAY | Freq: Every day | RESPIRATORY_TRACT | 2 refills | Status: DC
Start: 2020-10-20 — End: 2020-12-13

## 2020-10-20 NOTE — Progress Notes (Signed)
Acute Office Visit  Subjective:    Patient ID: Jonathan Dean, male    DOB: 1950/12/24, 70 y.o.   MRN: [id number]  Chief Complaint  Patient presents with  . Follow-up    Pt is still having some tightness in chest he is finsihed with prednisone     HPI Patient is in today for evaluation of persistent cough and dyspnea, which is worse with exertion. He has completed steroid course, which has made her symptoms better, but still c/o dyspnea on exertion at times. He has to use Albuterol inhaler every 6 hours. He had completed Augmentin for possible URTI. He denies any new environmental exposure or change in home. He states that warm/hot water shower seems to provoke coughing spells at times. He denies any fever, chills, fatigue or night sweats. His appetite has improved compared to prior.  His BP was 161/93 in the office today, and has been fluctuating at home as well. Of note, he had been placed on steroid recently for his respiratory symptoms.  Past Medical History:  Diagnosis Date  . ERECTILE DYSFUNCTION 06/30/2009   Qualifier: Diagnosis of  By: Andria Frames MD, Gwyndolyn Saxon    . Glaucoma   . Hyperlipidemia   . Polyp of colon, adenomatous 09/15/2011   Colonoscopy 11/07/11 removed at least two adenomatous polyps confirmed by surgical path.      History reviewed. No pertinent surgical history.  History reviewed. No pertinent family history.  Social History   Socioeconomic History  . Marital status: Married    Spouse name: Not on file  . Number of children: Not on file  . Years of education: Not on file  . Highest education level: Not on file  Occupational History  . Not on file  Tobacco Use  . Smoking status: Former Smoker    Years: 25.00    Types: Cigarettes    Quit date: 09/14/2005    Years since quitting: 15.1  . Smokeless tobacco: Never Used  Substance and Sexual Activity  . Alcohol use: Yes    Alcohol/week: 2.0 standard drinks    Types: 2 drink(s) per week  . Drug use: No   . Sexual activity: Yes    Partners: Female    Comment: monogamous  Other Topics Concern  . Not on file  Social History Narrative  . Not on file   Social Determinants of Health   Financial Resource Strain: Not on file  Food Insecurity: Not on file  Transportation Needs: Not on file  Physical Activity: Not on file  Stress: Not on file  Social Connections: Not on file  Intimate Partner Violence: Not on file    Outpatient Medications Prior to Visit  Medication Sig Dispense Refill  . albuterol (VENTOLIN HFA) 108 (90 Base) MCG/ACT inhaler Inhale 2 puffs into the lungs every 6 (six) hours as needed for wheezing or shortness of breath. 8 g 0  . dorzolamide-timolol (COSOPT) 22.3-6.8 MG/ML ophthalmic solution     . latanoprost (XALATAN) 0.005 % ophthalmic solution     . Multiple Vitamins-Minerals (MULTIVITAMIN WITH MINERALS) tablet Take 1 tablet by mouth as needed.     No facility-administered medications prior to visit.    Allergies  Allergen Reactions  . Dorzolamide Hcl-Timolol Mal Other (See Comments)    Swelling of eye lids only the [employer] brand of cosopt drug store is aware of this problem    Review of Systems  Constitutional: Negative for chills and fever.  HENT: Negative for congestion and sore  throat.   Eyes: Negative for pain and discharge.  Respiratory: Positive for cough, shortness of breath and wheezing.   Cardiovascular: Negative for chest pain and palpitations.  Gastrointestinal: Negative for constipation, diarrhea, nausea and vomiting.  Endocrine: Negative for polydipsia and polyuria.  Genitourinary: Negative for dysuria and hematuria.  Musculoskeletal: Negative for neck pain and neck stiffness.  Skin: Negative for rash.  Neurological: Negative for dizziness, weakness, numbness and headaches.  Psychiatric/Behavioral: Negative for agitation and behavioral problems.       Objective:    Physical Exam Vitals reviewed.  Constitutional:      General: He is not  in acute distress.    Appearance: He is not diaphoretic.  HENT:     Head: Normocephalic and atraumatic.     Nose: Nose normal.     Mouth/Throat:     Mouth: Mucous membranes are moist.  Eyes:     General: No scleral icterus.    Extraocular Movements: Extraocular movements intact.     Pupils: Pupils are equal, round, and reactive to light.  Cardiovascular:     Rate and Rhythm: Normal rate and regular rhythm.     Pulses: Normal pulses.     Heart sounds: No murmur heard.   Pulmonary:     Breath sounds: Wheezing (Mild, b/l) present. No rales.  Musculoskeletal:     Cervical back: Neck supple. No tenderness.     Right lower leg: No edema.     Left lower leg: No edema.  Skin:    General: Skin is warm.     Findings: No rash.  Neurological:     General: No focal deficit present.     Mental Status: He is alert and oriented to person, place, and time.  Psychiatric:        Mood and Affect: Mood normal.        Behavior: Behavior normal.     BP (!) 161/93 (BP Location: Right Arm, Patient Position: Sitting, Cuff Size: Normal)   Pulse 73   Temp 98.1 F (36.7 C) (Oral)   Resp 18   Ht 5\' 10"  (1.778 m)   Wt 164 lb 12.8 oz (74.8 kg)   SpO2 99%   BMI 23.65 kg/m  Wt Readings from Last 3 Encounters:  10/20/20 164 lb 12.8 oz (74.8 kg)  09/20/20 167 lb (75.8 kg)  07/26/20 179 lb (81.2 kg)    Health Maintenance Due  Topic Date Due  . Hepatitis C Screening  Never done  . COVID-19 Vaccine (1) Never done  . COLONOSCOPY (Pts 45-41yrs Insurance coverage will need to be confirmed)  11/06/2016  . TETANUS/TDAP  06/29/2018    There are no preventive care reminders to display for this patient.   No results found for: TSH No results found for: WBC, HGB, HCT, MCV, PLT Lab Results  Component Value Date   NA 138 10/04/2012   K 4.2 10/04/2012   CO2 28 10/04/2012   GLUCOSE 95 10/04/2012   BUN 15 10/04/2012   CREATININE 1.30 10/04/2012   BILITOT 1.1 10/04/2012   ALKPHOS 57 10/04/2012    AST 15 10/04/2012   ALT 16 10/04/2012   PROT 6.6 10/04/2012   ALBUMIN 4.4 10/04/2012   CALCIUM 9.7 10/04/2012   Lab Results  Component Value Date   CHOL 175 10/04/2012   Lab Results  Component Value Date   HDL 42 10/04/2012   Lab Results  Component Value Date   LDLCALC 110 (H) 10/04/2012   Lab Results  Component  Value Date   TRIG 116 10/04/2012   Lab Results  Component Value Date   CHOLHDL 4.2 10/04/2012   No results found for: HGBA1C     Assessment & Plan:   Problem List Items Addressed This Visit      Respiratory   Bronchitis    Unclear etiology currently Has tried Augmentin Tried steroids for 2 weeks Still has dyspnea worse with exertion and chest tightness Check sputum culture and urine Legionella, strep Started Breo Ellipta for maintenance Has been using Albuterol q6h       Other Visit Diagnoses    Persistent cough    -  Primary   Relevant Medications   fluticasone furoate-vilanterol (BREO ELLIPTA) 100-25 MCG/INH AEPB   Other Relevant Orders   Ambulatory referral to Pulmonology   Strep pneumoniae urinary antigen   Legionella Antigen, Urine   Respiratory or Resp and Sputum Culture     Elevated BP Likely due to recent steroids Advised to monitor at BP and contact with persistently elevated BP   Meds ordered this encounter  Medications  . fluticasone furoate-vilanterol (BREO ELLIPTA) 100-25 MCG/INH AEPB    Sig: Inhale 1 puff into the lungs daily.    Dispense:  1 each    Refill:  2     Kambrey Hagger Keith Rake, MD

## 2020-10-20 NOTE — Patient Instructions (Signed)
Please start using Breo inhaler as prescribed.  Please use Albuterol inhaler for shortness of breath/wheezing.  Please avoid cold exposure if possible.  You are being referred to Pulmonology for evaluation of persistent shortness of breath/cough and for pulmonary function testing.

## 2020-10-20 NOTE — Assessment & Plan Note (Signed)
Unclear etiology currently Has tried Augmentin Tried steroids for 2 weeks Still has dyspnea worse with exertion and chest tightness Check sputum culture and urine Legionella, strep Started Breo Ellipta for maintenance Has been using Albuterol q6h

## 2020-10-31 ENCOUNTER — Other Ambulatory Visit: Payer: Self-pay | Admitting: Internal Medicine

## 2020-10-31 DIAGNOSIS — J40 Bronchitis, not specified as acute or chronic: Secondary | ICD-10-CM

## 2020-11-19 ENCOUNTER — Encounter: Payer: Self-pay | Admitting: Internal Medicine

## 2020-11-22 ENCOUNTER — Other Ambulatory Visit: Payer: Self-pay | Admitting: Internal Medicine

## 2020-11-22 DIAGNOSIS — R030 Elevated blood-pressure reading, without diagnosis of hypertension: Secondary | ICD-10-CM

## 2020-11-22 DIAGNOSIS — Z7689 Persons encountering health services in other specified circumstances: Secondary | ICD-10-CM

## 2020-11-23 ENCOUNTER — Encounter: Payer: No Typology Code available for payment source | Admitting: Internal Medicine

## 2020-11-23 LAB — CMP14+EGFR
ALT: 40 IU/L (ref 0–44)
AST: 26 IU/L (ref 0–40)
Albumin/Globulin Ratio: 1.5 (ref 1.2–2.2)
Albumin: 4.2 g/dL (ref 3.8–4.8)
Alkaline Phosphatase: 68 IU/L (ref 44–121)
BUN/Creatinine Ratio: 10 (ref 10–24)
BUN: 11 mg/dL (ref 8–27)
Bilirubin Total: 0.4 mg/dL (ref 0.0–1.2)
CO2: 24 mmol/L (ref 20–29)
Calcium: 9.4 mg/dL (ref 8.6–10.2)
Chloride: 102 mmol/L (ref 96–106)
Creatinine, Ser: 1.09 mg/dL (ref 0.76–1.27)
Globulin, Total: 2.8 g/dL (ref 1.5–4.5)
Glucose: 101 mg/dL — ABNORMAL HIGH (ref 65–99)
Potassium: 4.5 mmol/L (ref 3.5–5.2)
Sodium: 139 mmol/L (ref 134–144)
Total Protein: 7 g/dL (ref 6.0–8.5)
eGFR: 73 mL/min/{1.73_m2} (ref >59)

## 2020-11-23 LAB — TSH: TSH: 2.72 u[IU]/mL (ref 0.450–4.500)

## 2020-11-23 LAB — CBC
Hematocrit: 43.6 % (ref 37.5–51.0)
Hemoglobin: 14.9 g/dL (ref 13.0–17.7)
MCH: 31.9 pg (ref 26.6–33.0)
MCHC: 34.2 g/dL (ref 31.5–35.7)
MCV: 93 fL (ref 79–97)
Platelets: 255 10*3/uL (ref 150–450)
RBC: 4.67 x10E6/uL (ref 4.14–5.80)
RDW: 13 % (ref 11.6–15.4)
WBC: 5.8 10*3/uL (ref 3.4–10.8)

## 2020-11-23 LAB — PSA: Prostate Specific Ag, Serum: 3.2 ng/mL (ref 0.0–4.0)

## 2020-11-23 LAB — LIPID PANEL
Chol/HDL Ratio: 4.1 ratio (ref 0.0–5.0)
Cholesterol, Total: 249 mg/dL — ABNORMAL HIGH (ref 100–199)
HDL: 61 mg/dL (ref >39)
LDL Chol Calc (NIH): 162 mg/dL — ABNORMAL HIGH (ref 0–99)
Triglycerides: 146 mg/dL (ref 0–149)
VLDL Cholesterol Cal: 26 mg/dL (ref 5–40)

## 2020-11-23 LAB — HEMOGLOBIN A1C
Est. average glucose Bld gHb Est-mCnc: 120 mg/dL
Hgb A1c MFr Bld: 5.8 % — ABNORMAL HIGH (ref 4.8–5.6)

## 2020-11-26 DIAGNOSIS — J40 Bronchitis, not specified as acute or chronic: Secondary | ICD-10-CM | POA: Diagnosis not present

## 2020-11-29 LAB — GRAM STAIN W/SPUTUM CULT RFLX
Result 1: NONE SEEN
White Blood Cells: NONE SEEN

## 2020-11-29 LAB — STREPTOCOCCUS PNEUMONIAE AG (CSF): Streptococcus Pneumoniae Ag: NEGATIVE

## 2020-11-29 LAB — SPUTUM CULTURE

## 2020-11-30 LAB — LEGIONELLA PNEUMOPHILA SEROGP 1 UR AG

## 2020-12-13 ENCOUNTER — Other Ambulatory Visit: Payer: Self-pay

## 2020-12-13 ENCOUNTER — Other Ambulatory Visit (HOSPITAL_COMMUNITY)
Admission: RE | Admit: 2020-12-13 | Discharge: 2020-12-13 | Disposition: A | Discharge status: Home / Self Care | Payer: PPO | Source: Ambulatory Visit | Attending: Internal Medicine | Admitting: Internal Medicine

## 2020-12-13 ENCOUNTER — Encounter: Payer: Self-pay | Admitting: Internal Medicine

## 2020-12-13 ENCOUNTER — Ambulatory Visit (INDEPENDENT_AMBULATORY_CARE_PROVIDER_SITE_OTHER): Payer: PPO | Admitting: Internal Medicine

## 2020-12-13 DIAGNOSIS — R0600 Dyspnea, unspecified: Secondary | ICD-10-CM | POA: Insufficient documentation

## 2020-12-13 DIAGNOSIS — R0609 Other forms of dyspnea: Secondary | ICD-10-CM

## 2020-12-13 LAB — BRAIN NATRIURETIC PEPTIDE: B Natriuretic Peptide: 78 pg/mL (ref 0.0–100.0)

## 2020-12-13 LAB — SEDIMENTATION RATE: Sed Rate: 6 mm/hr (ref 0–16)

## 2020-12-13 LAB — BASIC METABOLIC PANEL
Anion gap: 11 (ref 5–15)
BUN: 13 mg/dL (ref 8–23)
CO2: 27 mmol/L (ref 22–32)
Calcium: 9.9 mg/dL (ref 8.9–10.3)
Chloride: 98 mmol/L (ref 98–111)
Creatinine, Ser: 1.09 mg/dL (ref 0.61–1.24)
GFR, Estimated: >60 mL/min (ref >60)
Glucose, Bld: 141 mg/dL — ABNORMAL HIGH (ref 70–99)
Potassium: 4.1 mmol/L (ref 3.5–5.1)
Sodium: 136 mmol/L (ref 135–145)

## 2020-12-13 LAB — D-DIMER, QUANTITATIVE: D-Dimer, Quant: 0.39 ug/mL-FEU (ref 0.00–0.50)

## 2020-12-13 NOTE — Patient Instructions (Signed)
Try albuterol 15 min before an activity that you know would make you short of breath and see if it makes any difference and if makes none then don't take it after activity unless you can't catch your breath.   Only use your albuterol as a rescue medication to be used if you can't catch your breath by resting or doing a relaxed purse lip breathing pattern.  - The less you use it, the better it will work when you need it. - Ok to use up to 2 puffs  every 4 hours if you must but call for immediate appointment if use goes up over your usual need - Don't leave home without it !!  (think of it like the spare tire for your car)     Please schedule a follow up office visit in 4 weeks, sooner if needed

## 2020-12-13 NOTE — Progress Notes (Signed)
[name], male    DOB: 1951-03-18,   MRN: [id number]   Brief patient profile:  42 yowm quit  Smoking 09/14/2005 sudden onset sob no appetite, losing wt  eval  W/in last several  Weeks in dec 2021 then w/in a few days wife same symptoms, pt  started inhaler/abx  >  Breo:  No better so referred to pulmonary clinic in [facility]  12/13/2020 by Dr    Posey Pronto      History of Present Illness  12/13/2020  Pulmonary/ 1st office eval/ Melvyn Novas / [facility]  Chief Complaint  Patient presents with   Pulmonary Consult    Referred by Dr Posey Pronto. Pt states having difficulty with his breathing since Dec 2021 after having PNA. He has occ wheezing. Some days has trouble walking short distances. He has been using his albuterol inhaler daily.   Dyspnea: as of  Early dc 2021 able to walk several miles with wife now . 600 ft to mb some hills can do s stopping but much more difficult  Cough: none  Sleep: fine 10 degrees  SABA use: twice daily  - not sure helping/never pre challenges before ex  No obvious day to day or daytime variability or assoc excess/ purulent sputum or mucus plugs or hemoptysis or cp or chest tightness,   or overt sinus or hb symptoms.   Sleeping fine as abov e  without nocturnal  or early am exacerbation  of respiratory  c/o's or need for noct saba. Also denies any obvious fluctuation of symptoms with weather or environmental changes or other aggravating or alleviating factors except as outlined above   No unusual exposure hx or h/o childhood pna/ asthma or knowledge of premature birth.  Current Allergies, Complete Past Medical History, Past Surgical History, Family History, and Social History were reviewed in [employer] record.  ROS  The following are not active complaints unless bolded Hoarseness, sore throat, dysphagia, dental problems, itching, sneezing,  nasal congestion or discharge of excess mucus or purulent secretions, ear ache,   fever, chills,  sweats, unintended wt loss or wt gain, classically pleuritic or exertional cp,  orthopnea pnd or arm/hand swelling  or leg swelling, presyncope, palpitations, abdominal pain, anorexia, nausea, vomiting, diarrhea  or change in bowel habits or change in bladder habits, change in stools or change in urine, dysuria, hematuria,  rash, arthralgias, visual complaints, headache, numbness, weakness or ataxia or problems with walking or coordination,  change in mood or  memory.           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      Outpatient Medications Prior to Visit  Medication Sig Dispense Refill   albuterol (VENTOLIN HFA) 108 (90 Base) MCG/ACT inhaler INHALE 2 PUFFS INTO THE LUNGS EVERY 6 HOURS AS NEEDED FOR WHEEZING OR SHORTNESS OF BREATH 6.7 g 2   dorzolamide-timolol (COSOPT) 22.3-6.8 MG/ML ophthalmic solution      latanoprost (XALATAN) 0.005 % ophthalmic solution      Multiple Vitamins-Minerals (MULTIVITAMIN WITH MINERALS) tablet Take 1 tablet by mouth as needed.     fluticasone furoate-vilanterol (BREO ELLIPTA) 100-25 MCG/INH AEPB Inhale 1 puff into the lungs daily. 1 each 2   No facility-administered medications  prior to visit.     Objective:     BP 130/86 (BP Location: Left Arm, Cuff Size: Normal)    Pulse 78    Temp (!) 97.1 F (36.2 C) (Temporal)    Ht 5\' 10"  (1.778 m)    Wt 170 lb (77.1 kg)    SpO2 98% Comment: on RA   BMI 24.39 kg/m   SpO2: 98 % (on RA)    amb stoic  wm no rx today yet  HEENT : pt wearing mask not removed for exam due to covid - 19 concerns.   NECK :  without JVD/Nodes/TM/ nl carotid upstrokes bilaterally   LUNGS: no acc muscle use,  Min barrel  contour chest wall with bilateral  slightly decreased bs s  audible wheeze and  without cough on insp or exp maneuvers and min  Hyperresonant  to  percussion bilaterally     CV:  RRR  no s3 or murmur or increase in P2, and no edema   ABD:  soft and nontender with pos end  insp Hoover's  in the supine position. No bruits or organomegaly appreciated, bowel sounds nl  MS:   Nl gait/  ext warm without deformities, calf tenderness, cyanosis or clubbing No obvious joint restrictions   SKIN: warm and dry without lesions    NEURO:  alert, approp, nl sensorium with  no motor or cerebellar deficits apparent.         I personally reviewed images and agree with radiology impression as follows:  CXR:   Pa and lat 09/30/20 Bronchitic changes and suspect postinflammatory BILATERAL upper lobe scarring with associated volume loss. Remaining lungs clear. My impression:  Markings are def increased diffusely but non -specific   Labs ordered/ reviewed:      Chemistry      Component Value Date/Time   NA 136 12/13/2020 1240   NA 139 11/22/2020 0853   K 4.1 12/13/2020 1240   CL 98 12/13/2020 1240   CO2 27 12/13/2020 1240   BUN 13 12/13/2020 1240   BUN 11 11/22/2020 0853   CREATININE 1.09 12/13/2020 1240   CREATININE 1.30 10/04/2012 0925      Component Value Date/Time   CALCIUM 9.9 12/13/2020 1240   ALKPHOS 68 11/22/2020 0853   AST 26 11/22/2020 0853   ALT 40 11/22/2020 0853   BILITOT 0.4 11/22/2020 0853        Lab Results  Component Value Date   WBC 5.8 11/22/2020   HGB 14.9 11/22/2020   HCT 43.6 11/22/2020   MCV 93 11/22/2020   PLT 255 11/22/2020     Lab Results  Component Value Date   DDIMER 0.39 12/13/2020      Lab Results  Component Value Date   TSH 2.720 11/22/2020      BNP  12/13/2020   = 78     Lab Results  Component Value Date   ESRSEDRATE 6 12/13/2020          Assessment   DOE (dyspnea on exertion) Sudden onset ? Mid/late Dec 2021 with neg w/u for covid 09/30/20 and non-specific cxr  - 12/13/2020  After extensive  coaching inhaler device,  effectiveness =    50% from a baseline of nearly 0   I strongly suspect he had covid as wife had similar symptoms w/in days and he has improved since onset albeit not back to baseline yet with minimal intervention  Rec: I spent extra time with pt today reviewing appropriate use of  albuterol for prn use on exertion with the following points: 1) saba is for relief of sob that does not improve by walking a slower pace or resting but rather if the pt does not improve after trying this first. 2) If the pt is convinced, as many are, that saba helps recover from activity faster then it's easy to tell if this is the case by re-challenging : ie stop, take the inhaler, then p 5 minutes try the exact same activity (intensity of workload) that just caused the symptoms and see if they are substantially diminished or not after saba 3) if there is an activity that reproducibly causes the symptoms, try the saba 15 min before the activity on alternate days   If in fact the saba really does help, then fine to continue to use it prn but advised may need to look closer at the maintenance regimen (which is 0) being used to achieve better control of airways disease with exertion.   >>> return with pfts in 4 weeks.          Each maintenance medication was reviewed in detail including emphasizing most importantly the difference between maintenance and prns and under what circumstances the prns are to be triggered using an action plan format where appropriate.  Total time for H and P, chart review, counseling, reviewing hfa device(s) and generating customized AVS unique to this new pt office visit / same day charting = 60 min             Christinia Gully, MD 12/13/2020

## 2020-12-14 ENCOUNTER — Encounter: Payer: Self-pay | Admitting: Internal Medicine

## 2020-12-14 ENCOUNTER — Telehealth: Payer: Self-pay | Admitting: *Deleted

## 2020-12-14 LAB — ALPHA-1 ANTITRYPSIN PHENOTYPE: A-1 Antitrypsin, Ser: 118 mg/dL (ref 101–187)

## 2020-12-14 NOTE — Assessment & Plan Note (Signed)
Sudden onset ? Mid/late Dec 2021 with neg w/u for covid 09/30/20 and non-specific cxr  - 12/13/2020  After extensive coaching inhaler device,  effectiveness =    50% from a baseline of nearly 0   I strongly suspect he had covid as wife had similar symptoms w/in days and he has improved since onset albeit not back to baseline yet with minimal intervention  Rec: I spent extra time with pt today reviewing appropriate use of albuterol for prn use on exertion with the following points: 1) saba is for relief of sob that does not improve by walking a slower pace or resting but rather if the pt does not improve after trying this first. 2) If the pt is convinced, as many are, that saba helps recover from activity faster then it's easy to tell if this is the case by re-challenging : ie stop, take the inhaler, then p 5 minutes try the exact same activity (intensity of workload) that just caused the symptoms and see if they are substantially diminished or not after saba 3) if there is an activity that reproducibly causes the symptoms, try the saba 15 min before the activity on alternate days   If in fact the saba really does help, then fine to continue to use it prn but advised may need to look closer at the maintenance regimen (which is 0) being used to achieve better control of airways disease with exertion.   >>> return with pfts in 4 weeks.          Each maintenance medication was reviewed in detail including emphasizing most importantly the difference between maintenance and prns and under what circumstances the prns are to be triggered using an action plan format where appropriate.  Total time for H and P, chart review, counseling, reviewing hfa device(s) and generating customized AVS unique to this new pt office visit / same day charting = 60 min

## 2020-12-14 NOTE — Telephone Encounter (Signed)
-----   Message from Tanda Rockers, MD sent at 12/14/2020  5:48 AM EDT ----- Add pfts to next ov and no inhalers that day

## 2020-12-14 NOTE — Telephone Encounter (Signed)
Tried calling the pt and there was no answer and his VM has not been set up yet. WCB.

## 2020-12-15 ENCOUNTER — Encounter: Payer: Self-pay | Admitting: Internal Medicine

## 2020-12-15 ENCOUNTER — Other Ambulatory Visit: Payer: Self-pay

## 2020-12-15 ENCOUNTER — Encounter: Payer: Self-pay | Admitting: *Deleted

## 2020-12-15 ENCOUNTER — Ambulatory Visit (INDEPENDENT_AMBULATORY_CARE_PROVIDER_SITE_OTHER): Payer: PPO | Admitting: Internal Medicine

## 2020-12-15 VITALS — BP 111/79 | HR 76 | Resp 18 | Ht 69.0 in | Wt 170.8 lb

## 2020-12-15 DIAGNOSIS — Z0000 Encounter for general adult medical examination without abnormal findings: Secondary | ICD-10-CM | POA: Diagnosis not present

## 2020-12-15 DIAGNOSIS — R0600 Dyspnea, unspecified: Secondary | ICD-10-CM

## 2020-12-15 DIAGNOSIS — R0609 Other forms of dyspnea: Secondary | ICD-10-CM

## 2020-12-15 MED ORDER — UNABLE TO FIND: 0 refills | Status: DC

## 2020-12-15 NOTE — Progress Notes (Signed)
Established Patient Office Visit  Subjective:  Patient ID: Jonathan Dean, male    DOB: 04-28-1951  Age: 70 y.o. MRN: [id number]  CC:  Chief Complaint  Patient presents with  . Annual Exam    Pt here for annual exam still having breathing issues     HPI Jonathan Dean is  a 70 y.o. male with past medical history of hyperlipidemia, glaucoma and colon polyps who presents for annual physical.  He still c/o dyspnea worse with exertion and has been having cough and wheezing. He states that Breo helped with breathing, but he would prefer not to take it due to his glaucoma as [facility] has steroid in it. He visited Pulmonology for evaluation and is planned to have PFTs. He denies any fever, chills, sore throat, nasal congestion, postnasal drip.  Past Medical History:  Diagnosis Date  . ERECTILE DYSFUNCTION 06/30/2009   Qualifier: Diagnosis of  By: Andria Frames MD, Gwyndolyn Saxon    . Glaucoma   . Hyperlipidemia   . Polyp of colon, adenomatous 09/15/2011   Colonoscopy 11/07/11 removed at least two adenomatous polyps confirmed by surgical path.      History reviewed. No pertinent surgical history.  History reviewed. No pertinent family history.  Social History   Socioeconomic History  . Marital status: Married    Spouse name: Not on file  . Number of children: Not on file  . Years of education: Not on file  . Highest education level: Not on file  Occupational History  . Not on file  Tobacco Use  . Smoking status: Former Smoker    Packs/day: 1.50    Years: 25.00    Pack years: 37.50    Types: Cigarettes    Quit date: 09/14/2005    Years since quitting: 15.2  . Smokeless tobacco: Never Used  Substance and Sexual Activity  . Alcohol use: Yes    Alcohol/week: 2.0 standard drinks    Types: 2 Standard drinks or equivalent per week  . Drug use: No  . Sexual activity: Yes    Partners: Female    Comment: monogamous  Other Topics Concern  . Not on file  Social History Narrative  . Not on  file   Social Determinants of Health   Financial Resource Strain: Not on file  Food Insecurity: Not on file  Transportation Needs: Not on file  Physical Activity: Not on file  Stress: Not on file  Social Connections: Not on file  Intimate Partner Violence: Not on file    Outpatient Medications Prior to Visit  Medication Sig Dispense Refill  . albuterol (VENTOLIN HFA) 108 (90 Base) MCG/ACT inhaler INHALE 2 PUFFS INTO THE LUNGS EVERY 6 HOURS AS NEEDED FOR WHEEZING OR SHORTNESS OF BREATH 6.7 g 2  . dorzolamide-timolol (COSOPT) 22.3-6.8 MG/ML ophthalmic solution     . latanoprost (XALATAN) 0.005 % ophthalmic solution     . Multiple Vitamins-Minerals (MULTIVITAMIN WITH MINERALS) tablet Take 1 tablet by mouth as needed.     No facility-administered medications prior to visit.    Allergies  Allergen Reactions  . Dorzolamide Hcl-Timolol Mal Other (See Comments)    Swelling of eye lids only the [employer] brand of cosopt drug store is aware of this problem    ROS Review of Systems  Constitutional: Negative for chills and fever.  HENT: Negative for congestion and sore throat.   Eyes: Negative for pain and discharge.  Respiratory: Positive for cough and shortness of breath.   Cardiovascular: Negative  for chest pain and palpitations.  Gastrointestinal: Negative for constipation, diarrhea, nausea and vomiting.  Endocrine: Negative for polydipsia and polyuria.  Genitourinary: Negative for dysuria and hematuria.  Musculoskeletal: Negative for neck pain and neck stiffness.  Skin: Negative for rash.  Neurological: Negative for dizziness, weakness, numbness and headaches.  Psychiatric/Behavioral: Negative for agitation and behavioral problems.      Objective:    Physical Exam Vitals reviewed.  Constitutional:      General: He is not in acute distress.    Appearance: He is not diaphoretic.  HENT:     Head: Normocephalic and atraumatic.     Nose: Nose normal.     Mouth/Throat:      Mouth: Mucous membranes are moist.  Eyes:     General: No scleral icterus.    Extraocular Movements: Extraocular movements intact.     Pupils: Pupils are equal, round, and reactive to light.  Cardiovascular:     Rate and Rhythm: Normal rate and regular rhythm.     Pulses: Normal pulses.     Heart sounds: No murmur heard.   Pulmonary:     Breath sounds: No wheezing or rales.  Musculoskeletal:     Cervical back: Neck supple. No tenderness.     Right lower leg: No edema.     Left lower leg: No edema.  Skin:    General: Skin is warm.     Findings: No rash.  Neurological:     General: No focal deficit present.     Mental Status: He is alert and oriented to person, place, and time.  Psychiatric:        Mood and Affect: Mood normal.        Behavior: Behavior normal.     BP 111/79 (BP Location: Right Arm, Patient Position: Sitting)   Pulse 76   Resp 18   Ht 5\' 9"  (1.753 m)   Wt 170 lb 12.8 oz (77.5 kg)   SpO2 95%   BMI 25.22 kg/m  Wt Readings from Last 3 Encounters:  12/15/20 170 lb 12.8 oz (77.5 kg)  12/13/20 170 lb (77.1 kg)  10/20/20 164 lb 12.8 oz (74.8 kg)     Health Maintenance Due  Topic Date Due  . Hepatitis C Screening  Never done  . COLONOSCOPY (Pts 45-59yrs Insurance coverage will need to be confirmed)  11/06/2016  . TETANUS/TDAP  06/29/2018    There are no preventive care reminders to display for this patient.  Lab Results  Component Value Date   TSH 2.720 11/22/2020   Lab Results  Component Value Date   WBC 5.8 11/22/2020   HGB 14.9 11/22/2020   HCT 43.6 11/22/2020   MCV 93 11/22/2020   PLT 255 11/22/2020   Lab Results  Component Value Date   NA 136 12/13/2020   K 4.1 12/13/2020   CO2 27 12/13/2020   GLUCOSE 141 (H) 12/13/2020   BUN 13 12/13/2020   CREATININE 1.09 12/13/2020   BILITOT 0.4 11/22/2020   ALKPHOS 68 11/22/2020   AST 26 11/22/2020   ALT 40 11/22/2020   PROT 7.0 11/22/2020   ALBUMIN 4.2 11/22/2020   CALCIUM 9.9 12/13/2020    ANIONGAP 11 12/13/2020   Lab Results  Component Value Date   CHOL 249 (H) 11/22/2020   Lab Results  Component Value Date   HDL 61 11/22/2020   Lab Results  Component Value Date   LDLCALC 162 (H) 11/22/2020   Lab Results  Component Value Date  TRIG 146 11/22/2020   Lab Results  Component Value Date   CHOLHDL 4.1 11/22/2020   Lab Results  Component Value Date   HGBA1C 5.8 (H) 11/22/2020      Assessment & Plan:   Problem List Items Addressed This Visit      Encounter for annual physical exam - Primary   Annual exam as documented. Counseling done  re healthy lifestyle involving commitment to 150 minutes exercise per week, heart healthy diet, and attaining healthy weight.The importance of adequate sleep also discussed. Changes in health habits are decided on by the patient with goals and time frames  set for achieving them. Immunization and cancer screening needs are specifically addressed at this visit.       Other   DOE (dyspnea on exertion)    Unclear etiology currently Still has dyspnea worse with exertion and chest tightness Had started [facility] for maintenance, but patient prefers not to continue steroid inhaler due to glaucoma Follow up with Pulmonology, planned to have PFTs Albuterol q6h PRN Advised to try using Acapella device as respiratory therapy      Relevant Medications   UNABLE TO FIND    Meds ordered this encounter  Medications  . UNABLE TO FIND    Sig: Med Name: Acapella respiratory device. Use it twice daily.    Dispense:  1 each    Refill:  0    Follow-up: Return in about 6 months (around 06/17/2021) for Dyspnea on exertion.    Lindell Spar, MD

## 2020-12-15 NOTE — Assessment & Plan Note (Signed)
Annual exam as documented. Counseling done  re healthy lifestyle involving commitment to 150 minutes exercise per week, heart healthy diet, and attaining healthy weight.The importance of adequate sleep also discussed. Changes in health habits are decided on by the patient with goals and time frames  set for achieving them. Immunization and cancer screening needs are specifically addressed at this visit. 

## 2020-12-15 NOTE — Assessment & Plan Note (Signed)
Unclear etiology currently Still has dyspnea worse with exertion and chest tightness Had started [facility] for maintenance, but patient prefers not to continue steroid inhaler due to glaucoma Follow up with Pulmonology, planned to have PFTs Albuterol q6h PRN

## 2020-12-15 NOTE — Patient Instructions (Signed)
Please use Acapella device as advised.  Use Albuterol inhaler up to 4 times in a day for shortness of breath/wheezing.

## 2020-12-17 ENCOUNTER — Encounter: Payer: Self-pay | Admitting: *Deleted

## 2020-12-17 NOTE — Telephone Encounter (Signed)
Called the pt and there was no answer, VM not set up. Will mail letter.

## 2020-12-26 ENCOUNTER — Encounter: Payer: Self-pay | Admitting: Internal Medicine

## 2020-12-27 ENCOUNTER — Other Ambulatory Visit: Payer: Self-pay | Admitting: Internal Medicine

## 2020-12-27 DIAGNOSIS — R0609 Other forms of dyspnea: Secondary | ICD-10-CM

## 2020-12-27 DIAGNOSIS — J40 Bronchitis, not specified as acute or chronic: Secondary | ICD-10-CM

## 2020-12-27 DIAGNOSIS — R0600 Dyspnea, unspecified: Secondary | ICD-10-CM

## 2020-12-27 MED ORDER — UNABLE TO FIND: 0 refills | Status: DC

## 2021-01-03 ENCOUNTER — Telehealth (INDEPENDENT_AMBULATORY_CARE_PROVIDER_SITE_OTHER): Payer: Self-pay | Admitting: *Deleted

## 2021-01-03 ENCOUNTER — Other Ambulatory Visit (INDEPENDENT_AMBULATORY_CARE_PROVIDER_SITE_OTHER): Payer: Self-pay

## 2021-01-03 ENCOUNTER — Other Ambulatory Visit (INDEPENDENT_AMBULATORY_CARE_PROVIDER_SITE_OTHER): Payer: Self-pay | Admitting: *Deleted

## 2021-01-03 ENCOUNTER — Encounter (INDEPENDENT_AMBULATORY_CARE_PROVIDER_SITE_OTHER): Payer: Self-pay | Admitting: *Deleted

## 2021-01-03 DIAGNOSIS — Z86010 Personal history of colonic polyps: Secondary | ICD-10-CM

## 2021-01-03 MED ORDER — PEG 3350-KCL-NA BICARB-NACL 420 G PO SOLR: 4000.0000 mL | Freq: Once | ORAL | 0 refills | Status: AC

## 2021-01-03 NOTE — Telephone Encounter (Signed)
Done

## 2021-01-03 NOTE — Telephone Encounter (Signed)
Patient needs

## 2021-01-03 NOTE — Telephone Encounter (Signed)
Referring MD/PCP: patel   Procedure: tcs  Reason/Indication:  Hx polyps  Has patient had this procedure before?  Yes, 2013  If so, when, by whom and where?    Is there a family history of colon cancer?  no  Who?  What age when diagnosed?    Is patient diabetic?   no      Does patient have prosthetic heart valve or mechanical valve?  no  Do you have a pacemaker/defibrillator?  no  Has patient ever had endocarditis/atrial fibrillation? no  Have you had a stroke/heart attack last 6 mths? no  Does patient use oxygen? no  Has patient had joint replacement within last 12 months?  no  Is patient constipated or do they take laxatives? no  Does patient have a history of alcohol/drug use?  no  Is patient on blood thinner such as Coumadin, Plavix and/or Aspirin? no  Do you take medicine for weight loss. no  Medications: dorzolamide/timolol daily, latanoprost daily  Allergies: nkda  Medication Adjustment per Dr Rehman/Dr Jenetta Downer   Procedure date & time: 01/20/21

## 2021-01-11 ENCOUNTER — Ambulatory Visit (INDEPENDENT_AMBULATORY_CARE_PROVIDER_SITE_OTHER): Payer: PPO | Admitting: Internal Medicine

## 2021-01-11 ENCOUNTER — Other Ambulatory Visit: Payer: Self-pay

## 2021-01-11 ENCOUNTER — Encounter: Payer: Self-pay | Admitting: Internal Medicine

## 2021-01-11 DIAGNOSIS — R0600 Dyspnea, unspecified: Secondary | ICD-10-CM

## 2021-01-11 DIAGNOSIS — R0609 Other forms of dyspnea: Secondary | ICD-10-CM

## 2021-01-11 NOTE — Progress Notes (Addendum)
Jonathan Dean, male    DOB: Oct 08, 1950   MRN: [id number]   Brief patient profile:  12 yowm quit  Smoking 09/14/2005 sudden onset sob no appetite, losing wt  eval " last several  Weeks in dec 2021" then w/in a few days wife same symptoms so though they had covid but apparently tests neg>>>  pt  started inhaler/abx  >  Breo:  Some  better so referred to pulmonary clinic in [place]  12/13/2020 by Dr  Posey Pronto      History of Present Illness  12/13/2020  Pulmonary/ 1st office eval/ Melvyn Novas / [facility]  Chief Complaint  Patient presents with  . Pulmonary Consult    Referred by Dr Posey Pronto. Pt states having difficulty with his breathing since Dec 2021 after having PNA. He has occ wheezing. Some days has trouble walking short distances. He has been using his albuterol inhaler daily.   Dyspnea: as of  Early dc 2021 able to walk several miles with wife now . 600 ft to mb some hills can do s stopping but much more difficult  Cough: none  Sleep: fine 10 degrees  SABA use: twice daily  - not sure helping/never pre challenges before ex recTry albuterol 15 min before an activity that you know would make you short of breath  Only use your albuterol as a rescue medication    01/11/2021  f/u ov/[place] office/Tashawn Laswell re: doe p viral uri ? False neg for covid  Chief Complaint  Patient presents with  . Follow-up    Breathing is much improved. He has occ chest congestion and cough with white/clear sputum. He is using his albuterol inhaler 1 x per wk on average.   Dyspnea:  Not limited  Cough: min am mucus not even even a tsp  Sleeping: non noct symptoms  SABA use: none in the last 2 weeks  02: none  Covid status: vax x 3  Lung cancer screening: > 15 y out    No obvious day to day or daytime variability or assoc excess/ purulent sputum or mucus plugs or hemoptysis or cp or chest tightness, subjective wheeze or overt sinus or hb symptoms.   Sleeping  without nocturnal  or early am exacerbation  of  respiratory  c/o's or need for noct saba. Also denies any obvious fluctuation of symptoms with weather or environmental changes or other aggravating or alleviating factors except as outlined above   No unusual exposure hx or h/o childhood pna/ asthma or knowledge of premature birth.  Current Allergies, Complete Past Medical History, Past Surgical History, Family History, and Social History were reviewed in [employer] record.  ROS  The following are not active complaints unless bolded Hoarseness, sore throat, dysphagia, dental problems, itching, sneezing,  nasal congestion or discharge of excess mucus or purulent secretions, ear ache,   fever, chills, sweats, unintended wt loss or wt gain, classically pleuritic or exertional cp,  orthopnea pnd or arm/hand swelling  or leg swelling, presyncope, palpitations, abdominal pain, anorexia, nausea, vomiting, diarrhea  or change in bowel habits or change in bladder habits, change in stools or change in urine, dysuria, hematuria,  rash, arthralgias, visual complaints, headache, numbness, weakness or ataxia or problems with walking or coordination,  change in mood or  memory.        Current Meds  Medication Sig  . albuterol (VENTOLIN HFA) 108 (90 Base) MCG/ACT inhaler INHALE 2 PUFFS INTO THE LUNGS EVERY 6 HOURS AS NEEDED FOR WHEEZING OR  SHORTNESS OF BREATH  . dorzolamide-timolol (COSOPT) 22.3-6.8 MG/ML ophthalmic solution   . latanoprost (XALATAN) 0.005 % ophthalmic solution   . Multiple Vitamins-Minerals (MULTIVITAMIN WITH MINERALS) tablet Take 1 tablet by mouth as needed.            Objective:    Wt Readings from Last 3 Encounters:  12/15/20 170 lb 12.8 oz (77.5 kg)  12/13/20 170 lb (77.1 kg)  10/20/20 164 lb 12.8 oz (74.8 kg)      Vital signs reviewed  01/11/2021  - Note at rest 02 sats  98% on RA   General appearance:    amb wm nad    HEENT : pt wearing mask not removed for exam due to covid - 19 concerns.   NECK :   without JVD/Nodes/TM/ nl carotid upstrokes bilaterally   LUNGS: no acc muscle use,  Min barrel  Contour/ mild/mod pectus excavatum  chest wall with bilateral  slightly decreased bs s audible wheeze and  without cough on insp or exp maneuvers and min  Hyperresonant  to  percussion bilaterally     CV:  RRR  no s3 or murmur or increase in P2, and no edema   ABD:  soft and nontender with pos end  insp Hoover's  in the supine position. No bruits or organomegaly appreciated, bowel sounds nl  MS:   Nl gait/  ext warm without deformities, calf tenderness, cyanosis or clubbing No obvious joint restrictions   SKIN: warm and dry without lesions    NEURO:  alert, approp, nl sensorium with  no motor or cerebellar deficits apparent.      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 No radiographic evidence of acute cardiopulmonary disease.  my comment: minimal scarring of unlikely ongoing significance           Assessment

## 2021-01-11 NOTE — Patient Instructions (Signed)
Please remember to go to the  x-ray department  @  [facility] for your tests - we will call you with the results when they are available      Pulmonary follow up can be as needed

## 2021-01-11 NOTE — Assessment & Plan Note (Signed)
Quit smoking 2006 s residual symptoms - Sudden onset ? Mid/late Dec 2021 with neg w/u for covid 09/30/20 and non-specific cxr  - 12/13/2020  After extensive coaching inhaler device,  effectiveness =    50% from a baseline of nearly 0  - 12/13/20    Alpha one Phenotype     MS   Level 118  - 01/11/2021 reported back to baseline ex tol, min need for saba, f/u prn advised   Discussed significance of MS phenotype = no one in fm should smoke if they haven't had their phenotype checked as they would be taking a much higher risk of copd/emphysema.   Pulmonary f/u is prn          Each maintenance medication was reviewed in detail including emphasizing most importantly the difference between maintenance and prns and under what circumstances the prns are to be triggered using an action plan format where appropriate.  Total time for H and P, chart review, counseling, reviewing hfa  device(s) and generating customized AVS unique to this office visit / same day charting = 35 min

## 2021-01-17 ENCOUNTER — Encounter (HOSPITAL_COMMUNITY): Payer: Self-pay | Admitting: Internal Medicine

## 2021-01-18 ENCOUNTER — Other Ambulatory Visit (HOSPITAL_COMMUNITY)
Admission: RE | Admit: 2021-01-18 | Discharge: 2021-01-18 | Disposition: A | Discharge status: Home / Self Care | Payer: PPO | Source: Ambulatory Visit | Attending: Internal Medicine | Admitting: Internal Medicine

## 2021-01-18 ENCOUNTER — Ambulatory Visit (HOSPITAL_COMMUNITY)
Admission: RE | Admit: 2021-01-18 | Discharge: 2021-01-18 | Disposition: A | Discharge status: Home / Self Care | Payer: PPO | Source: Ambulatory Visit | Attending: Internal Medicine | Admitting: Internal Medicine

## 2021-01-18 ENCOUNTER — Other Ambulatory Visit: Payer: Self-pay

## 2021-01-18 DIAGNOSIS — B349 Viral infection, unspecified: Secondary | ICD-10-CM | POA: Diagnosis not present

## 2021-01-18 DIAGNOSIS — Z20822 Contact with and (suspected) exposure to covid-19: Secondary | ICD-10-CM | POA: Insufficient documentation

## 2021-01-18 DIAGNOSIS — J984 Other disorders of lung: Secondary | ICD-10-CM | POA: Diagnosis not present

## 2021-01-18 DIAGNOSIS — R0600 Dyspnea, unspecified: Secondary | ICD-10-CM | POA: Diagnosis not present

## 2021-01-18 DIAGNOSIS — R0609 Other forms of dyspnea: Secondary | ICD-10-CM

## 2021-01-18 DIAGNOSIS — I700 Atherosclerosis of aorta: Secondary | ICD-10-CM | POA: Insufficient documentation

## 2021-01-19 LAB — SARS CORONAVIRUS 2 (TAT 6-24 HRS): SARS Coronavirus 2: NEGATIVE

## 2021-01-20 ENCOUNTER — Encounter (HOSPITAL_COMMUNITY): Payer: Self-pay | Admitting: Internal Medicine

## 2021-01-20 ENCOUNTER — Encounter (HOSPITAL_COMMUNITY)
Admission: RE | Disposition: A | Discharge status: Home / Self Care | Payer: Self-pay | Source: Home / Self Care | Attending: Internal Medicine

## 2021-01-20 ENCOUNTER — Other Ambulatory Visit: Payer: Self-pay

## 2021-01-20 ENCOUNTER — Ambulatory Visit (HOSPITAL_COMMUNITY)
Admission: RE | Admit: 2021-01-20 | Discharge: 2021-01-20 | Disposition: A | Discharge status: Home / Self Care | Payer: No Typology Code available for payment source | Attending: Internal Medicine | Admitting: Internal Medicine

## 2021-01-20 ENCOUNTER — Encounter: Payer: Self-pay | Admitting: *Deleted

## 2021-01-20 DIAGNOSIS — Z1211 Encounter for screening for malignant neoplasm of colon: Secondary | ICD-10-CM | POA: Diagnosis not present

## 2021-01-20 DIAGNOSIS — D123 Benign neoplasm of transverse colon: Secondary | ICD-10-CM | POA: Diagnosis not present

## 2021-01-20 DIAGNOSIS — K621 Rectal polyp: Secondary | ICD-10-CM | POA: Diagnosis not present

## 2021-01-20 DIAGNOSIS — K644 Residual hemorrhoidal skin tags: Secondary | ICD-10-CM | POA: Insufficient documentation

## 2021-01-20 DIAGNOSIS — K5730 Diverticulosis of large intestine without perforation or abscess without bleeding: Secondary | ICD-10-CM | POA: Diagnosis not present

## 2021-01-20 DIAGNOSIS — Z09 Encounter for follow-up examination after completed treatment for conditions other than malignant neoplasm: Secondary | ICD-10-CM | POA: Diagnosis not present

## 2021-01-20 DIAGNOSIS — I700 Atherosclerosis of aorta: Secondary | ICD-10-CM | POA: Diagnosis not present

## 2021-01-20 DIAGNOSIS — Z888 Allergy status to other drugs, medicaments and biological substances status: Secondary | ICD-10-CM | POA: Insufficient documentation

## 2021-01-20 DIAGNOSIS — Z86010 Personal history of colonic polyps: Secondary | ICD-10-CM | POA: Diagnosis not present

## 2021-01-20 DIAGNOSIS — Z87891 Personal history of nicotine dependence: Secondary | ICD-10-CM | POA: Diagnosis not present

## 2021-01-20 DIAGNOSIS — Z79899 Other long term (current) drug therapy: Secondary | ICD-10-CM | POA: Insufficient documentation

## 2021-01-20 DIAGNOSIS — D125 Benign neoplasm of sigmoid colon: Secondary | ICD-10-CM | POA: Diagnosis not present

## 2021-01-20 HISTORY — PX: COLONOSCOPY: SHX5424

## 2021-01-20 HISTORY — PX: POLYPECTOMY: SHX5525

## 2021-01-20 LAB — HM COLONOSCOPY

## 2021-01-20 SURGERY — COLONOSCOPY
Anesthesia: Moderate Sedation

## 2021-01-20 MED ORDER — MEPERIDINE HCL 50 MG/ML IJ SOLN
INTRAMUSCULAR | Status: DC | PRN
  Administered 2021-01-20 (×2): 25 mg via INTRAVENOUS

## 2021-01-20 MED ORDER — MEPERIDINE HCL 50 MG/ML IJ SOLN
INTRAMUSCULAR | Status: AC
  Filled 2021-01-20: qty 1

## 2021-01-20 MED ORDER — STERILE WATER FOR IRRIGATION IR SOLN: Status: DC | PRN

## 2021-01-20 MED ORDER — MIDAZOLAM HCL 5 MG/5ML IJ SOLN
INTRAMUSCULAR | Status: AC
  Filled 2021-01-20: qty 10

## 2021-01-20 MED ORDER — MIDAZOLAM HCL 5 MG/5ML IJ SOLN
INTRAMUSCULAR | Status: DC | PRN
  Administered 2021-01-20: 2 mg via INTRAVENOUS
  Administered 2021-01-20 (×3): 1 mg via INTRAVENOUS
  Administered 2021-01-20: 2 mg via INTRAVENOUS

## 2021-01-20 MED ORDER — SODIUM CHLORIDE 0.9 % IV SOLN: INTRAVENOUS | Status: DC

## 2021-01-20 NOTE — Op Note (Signed)
[facility] Patient Name: Jonathan Dean Procedure Date: 01/20/2021 9:04 AM MRN: [id number] Date of Birth: 09-07-1951 Attending MD: Lionel December , MD CSN: [id number] Age: 70 Admit Type: Outpatient Procedure:                Colonoscopy Indications:              High risk colon cancer surveillance: Personal                            history of colonic polyps Providers:                Lionel December, MD, Brain Hilts, RN, Edythe Clarity,                            Technician, Burke Keels, Technician Referring MD:             Trena Platt, MD Medicines:                Meperidine 50 mg IV, Midazolam 7 mg IV Complications:            No immediate complications. Estimated Blood Loss:     Estimated blood loss was minimal. Procedure:                Pre-Anesthesia Assessment:                           - Prior to the procedure, a History and Physical                            was performed, and patient medications and                            allergies were reviewed. The patient's tolerance of                            previous anesthesia was also reviewed. The risks                            and benefits of the procedure and the sedation                            options and risks were discussed with the patient.                            All questions were answered, and informed consent                            was obtained. Prior Anticoagulants: The patient has                            taken no previous anticoagulant or antiplatelet                            agents except for aspirin. ASA Grade Assessment: II                            -  A patient with mild systemic disease. After                            reviewing the risks and benefits, the patient was                            deemed in satisfactory condition to undergo the                            procedure.                           After obtaining informed consent, the colonoscope                            was  passed under direct vision. Throughout the                            procedure, the patient's blood pressure, pulse, and                            oxygen saturations were monitored continuously. The                            PCF-HQ190L ([id number]) scope was introduced through                            the anus and advanced to the the cecum, identified                            by appendiceal orifice and ileocecal valve. The                            colonoscopy was performed without difficulty. The                            patient tolerated the procedure well. The quality                            of the bowel preparation was adequate. The                            ileocecal valve, appendiceal orifice, and rectum                            were photographed. Scope In: 9:39:17 AM Scope Out: 10:09:49 AM Scope Withdrawal Time: 0 hours 21 minutes 20 seconds  Total Procedure Duration: 0 hours 30 minutes 32 seconds  Findings:      The perianal and digital rectal examinations were normal.      Three sessile polyps were found in the sigmoid colon and transverse       colon. The polyps were small in size. These polyps were removed with a       cold snare. Resection and retrieval were complete. The pathology  specimen was placed into Bottle Number 1.      Scattered diverticula were found in the sigmoid colon.      Three polyps were found in the rectum. The polyps were small in size.       These polyps were removed with a cold snare. Resection and retrieval       were complete. The pathology specimen was placed into Bottle Number 2.      External hemorrhoids were found during retroflexion. The hemorrhoids       were medium-sized. Impression:               - Three small polyps in the sigmoid colon and in                            the transverse colon, removed with a cold snare.                            Resected and retrieved.                           - Diverticulosis in the sigmoid  colon.                           - Three small polyps in the rectum, removed with a                            cold snare. Resected and retrieved.                           - External hemorrhoids. Moderate Sedation:      Moderate (conscious) sedation was administered by the endoscopy nurse       and supervised by the endoscopist. The following parameters were       monitored: oxygen saturation, heart rate, blood pressure, CO2       capnography and response to care. Total physician intraservice time was       34 minutes. Recommendation:           - Patient has a contact number available for                            emergencies. The signs and symptoms of potential                            delayed complications were discussed with the                            patient. Return to normal activities tomorrow.                            Written discharge instructions were provided to the                            patient.                           - High fiber diet today.                           -  Continue present medications.                           - No aspirin, ibuprofen, naproxen, or other                            non-steroidal anti-inflammatory drugs for 1 day.                           - Await pathology results.                           - Repeat colonoscopy in 5 years for surveillance. Procedure Code(s):        --- Professional ---                           [phone], Colonoscopy, flexible; with removal of                            tumor(s), polyp(s), or other lesion(s) by snare                            technique                           99153, Moderate sedation; each additional 15                            minutes intraservice time                           G0500, Moderate sedation services provided by the                            same physician or other qualified health care                            professional performing a gastrointestinal                             endoscopic service that sedation supports,                            requiring the presence of an independent trained                            observer to assist in the monitoring of the                            patient's level of consciousness and physiological                            status; initial 15 minutes of intra-service time;                            patient age 20 years or  older (additional time may                            be reported with 61950, as appropriate) Diagnosis Code(s):        --- Professional ---                           Z86.010, Personal history of colonic polyps                           K63.5, Polyp of colon                           K62.1, Rectal polyp                           K64.4, Residual hemorrhoidal skin tags                           K57.30, Diverticulosis of large intestine without                            perforation or abscess without bleeding CPT copyright 2019 American Medical Association. All rights reserved. The codes documented in this report are preliminary and upon coder review may  be revised to meet current compliance requirements. Lionel December, MD Lionel December, MD 01/20/2021 10:20:14 AM This report has been signed electronically. Number of Addenda: 0

## 2021-01-20 NOTE — H&P (Signed)
ORVAL DORTCH is an 70 y.o. male.   Chief Complaint: Patient is here for colonoscopy. HPI: Patient is 70 year old Caucasian male who has a history of colonic adenomas who is here for surveillance colonoscopy.  His last exam was in February 2013.  He denies abdominal pain change in bowel habits or rectal bleeding. Family history is negative for CRC. He does not take aspirin or NSAIDs.  Past Medical History:  Diagnosis Date  . ERECTILE DYSFUNCTION 06/30/2009   Qualifier: Diagnosis of  By: Andria Frames MD, Gwyndolyn Saxon    . Glaucoma   . Hyperlipidemia   . Polyp of colon, adenomatous 09/15/2011   Colonoscopy 11/07/11 removed at least two adenomatous polyps confirmed by surgical path.      History reviewed. No pertinent surgical history.  History reviewed. No pertinent family history. Social History:  reports that he quit smoking about 15 years ago. His smoking use included cigarettes. He has a 37.50 pack-year smoking history. He has never used smokeless tobacco. He reports current alcohol use of about 2.0 standard drinks of alcohol per week. He reports that he does not use drugs.  Allergies:  Allergies  Allergen Reactions  . Dorzolamide Hcl-Timolol Mal Other (See Comments)    Swelling of eye lids only the [employer] brand of cosopt drug store is aware of this problem    Medications Prior to Admission  Medication Sig Dispense Refill  . albuterol (VENTOLIN HFA) 108 (90 Base) MCG/ACT inhaler INHALE 2 PUFFS INTO THE LUNGS EVERY 6 HOURS AS NEEDED FOR WHEEZING OR SHORTNESS OF BREATH (Patient taking differently: Inhale 2 puffs into the lungs every 6 (six) hours as needed for wheezing or shortness of breath.) 6.7 g 2  . dorzolamide-timolol (COSOPT) 22.3-6.8 MG/ML ophthalmic solution Place 1 drop into both eyes 2 (two) times daily.    Marland Kitchen latanoprost (XALATAN) 0.005 % ophthalmic solution Place 1 drop into both eyes at bedtime.    . Multiple Vitamins-Minerals (MULTIVITAMIN WITH MINERALS) tablet Take 1 tablet by  mouth as needed.      No results found for this or any previous visit (from the past 48 hour(s)). DG Chest 2 View  Result Date: 01/19/2021 CLINICAL DATA:  70 year old male with history of viral infection in January. EXAM: CHEST - 2 VIEW COMPARISON:  Chest x-ray 09/30/2020. FINDINGS: Lung volumes are normal. No consolidative airspace disease. No pleural effusions. No pneumothorax. Extensive pleuroparenchymal thickening and architectural distortion in the apices of the thorax bilaterally, similar to the prior study, most compatible with areas of chronic post infectious or inflammatory scarring. No pulmonary nodule or mass noted. Pulmonary vasculature and the cardiomediastinal silhouette are within normal limits. Atherosclerotic calcifications in the thoracic aorta. IMPRESSION: 1. 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 No radiographic evidence of acute cardiopulmonary disease. 2. Aortic atherosclerosis. Electronically Signed   By: Vinnie Langton M.D.   On: 01/19/2021 10:26    Review of Systems  Blood pressure 131/86, pulse (!) 59, temperature 98.1 F (36.7 C), temperature source Oral, resp. rate 14, height 5\' 10"  (1.778 m), SpO2 99 %. Physical Exam HENT:     Mouth/Throat:     Mouth: Mucous membranes are moist.     Pharynx: Oropharynx is clear.  Eyes:     General: No scleral icterus.    Conjunctiva/sclera: Conjunctivae normal.  Cardiovascular:     Rate and Rhythm: Normal rate and regular rhythm.     Heart sounds: Normal heart sounds. No murmur  heard.   Pulmonary:     Effort: Pulmonary effort is normal.     Breath sounds: Normal breath sounds.  Abdominal:     General: There is no distension.     Palpations: Abdomen is soft. There is no mass.     Tenderness: There is no abdominal tenderness.  Musculoskeletal:        General: No swelling.     Cervical back: Neck supple.  Lymphadenopathy:     Cervical: No cervical  adenopathy.  Skin:    General: Skin is warm and dry.  Neurological:     Mental Status: He is alert.      Assessment/Plan  History of colonic adenomas Surveillance colonoscopy  Hildred Laser, MD 01/20/2021, 9:26 AM

## 2021-01-20 NOTE — Discharge Instructions (Signed)
No aspirin or NSAIDs for 24 hours. Resume usual medications as before. High-fiber diet. No driving for 24 hours. Physician will call with biopsy results.   Colonoscopy, Adult, Care After This sheet gives you information about how to care for yourself after your procedure. Your doctor may also give you more specific instructions. If you have problems or questions, call your doctor. What can I expect after the procedure? After the procedure, it is common to have:  A small amount of blood in your poop (stool) for 24 hours.  Some gas.  Mild cramping or bloating in your belly (abdomen). Follow these instructions at home: Eating and drinking  Drink enough fluid to keep your pee (urine) pale yellow.  Follow instructions from your doctor about what you cannot eat or drink.  Return to your normal diet as told by your doctor. Avoid heavy or fried foods that are hard to digest.   Activity  Rest as told by your doctor.  Do not sit for a long time without moving. Get up to take short walks every 1-2 hours. This is important. Ask for help if you feel weak or unsteady.  Return to your normal activities as told by your doctor. Ask your doctor what activities are safe for you. To help cramping and bloating:  Try walking around.  Put heat on your belly as told by your doctor. Use the heat source that your doctor recommends, such as a moist heat pack or a heating pad. ? Put a towel between your skin and the heat source. ? Leave the heat on for 20-30 minutes. ? Remove the heat if your skin turns bright red. This is very important if you are unable to feel pain, heat, or cold. You may have a greater risk of getting burned.   General instructions  If you were given a medicine to help you relax (sedative) during your procedure, it can affect you for many hours. Do not drive or use machinery until your doctor says that it is safe.  For the first 24 hours after the procedure: ? Do not sign  important documents. ? Do not drink alcohol. ? Do your daily activities more slowly than normal. ? Eat foods that are soft and easy to digest.  Take over-the-counter or prescription medicines only as told by your doctor.  Keep all follow-up visits as told by your doctor. This is important. Contact a doctor if:  You have blood in your poop 2-3 days after the procedure. Get help right away if:  You have more than a small amount of blood in your poop.  You see large clumps of tissue (blood clots) in your poop.  Your belly is swollen.  You feel like you may vomit (nauseous).  You vomit.  You have a fever.  You have belly pain that gets worse, and medicine does not help your pain. Summary  After the procedure, it is common to have a small amount of blood in your poop. You may also have mild cramping and bloating in your belly.  If you were given a medicine to help you relax (sedative) during your procedure, it can affect you for many hours. Do not drive or use machinery until your doctor says that it is safe.  Get help right away if you have a lot of blood in your poop, feel like you may vomit, have a fever, or have more belly pain. This information is not intended to replace advice given to you by your  health care provider. Make sure you discuss any questions you have with your health care provider. Document Revised: 07/18/2019 Document Reviewed: 04/07/2019 Elsevier Patient Education  2021 [place].  Hemorrhoids Hemorrhoids are swollen veins in and around the rectum or anus. There are two types of hemorrhoids:  Internal hemorrhoids. These occur in the veins that are just inside the rectum. They may poke through to the outside and become irritated and painful.  External hemorrhoids. These occur in the veins that are outside the anus and can be felt as a painful swelling or hard lump near the anus. Most hemorrhoids do not cause serious problems, and they can be managed with  home treatments such as diet and lifestyle changes. If home treatments do not help the symptoms, procedures can be done to shrink or remove the hemorrhoids. What are the causes? This condition is caused by increased pressure in the anal area. This pressure may result from various things, including:  Constipation.  Straining to have a bowel movement.  Diarrhea.  Pregnancy.  Obesity.  Sitting for long periods of time.  Heavy lifting or other activity that causes you to strain.  Anal sex.  Riding a bike for a long period of time. What are the signs or symptoms? Symptoms of this condition include:  Pain.  Anal itching or irritation.  Rectal bleeding.  Leakage of stool (feces).  Anal swelling.  One or more lumps around the anus. How is this diagnosed? This condition can often be diagnosed through a visual exam. Other exams or tests may also be done, such as:  An exam that involves feeling the rectal area with a gloved hand (digital rectal exam).  An exam of the anal canal that is done using a small tube (anoscope).  A blood test, if you have lost a significant amount of blood.  A test to look inside the colon using a flexible tube with a camera on the end (sigmoidoscopy or colonoscopy). How is this treated? This condition can usually be treated at home. However, various procedures may be done if dietary changes, lifestyle changes, and other home treatments do not help your symptoms. These procedures can help make the hemorrhoids smaller or remove them completely. Some of these procedures involve surgery, and others do not. Common procedures include:  Rubber band ligation. Rubber bands are placed at the base of the hemorrhoids to cut off their blood supply.  Sclerotherapy. Medicine is injected into the hemorrhoids to shrink them.  Infrared coagulation. A type of light energy is used to get rid of the hemorrhoids.  Hemorrhoidectomy surgery. The hemorrhoids are  surgically removed, and the veins that supply them are tied off.  Stapled hemorrhoidopexy surgery. The surgeon staples the base of the hemorrhoid to the rectal wall. Follow these instructions at home: Eating and drinking  Eat foods that have a lot of fiber in them, such as whole grains, beans, nuts, fruits, and vegetables.  Ask your health care provider about taking products that have added fiber (fiber supplements).  Reduce the amount of fat in your diet. You can do this by eating low-fat dairy products, eating less red meat, and avoiding processed foods.  Drink enough fluid to keep your urine pale yellow.   Managing pain and swelling  Take warm sitz baths for 20 minutes, 3-4 times a day to ease pain and discomfort. You may do this in a bathtub or using a portable sitz bath that fits over the toilet.  If directed, apply ice to  the affected area. Using ice packs between sitz baths may be helpful. ? Put ice in a plastic bag. ? Place a towel between your skin and the bag. ? Leave the ice on for 20 minutes, 2-3 times a day.   General instructions  Take over-the-counter and prescription medicines only as told by your health care provider.  Use medicated creams or suppositories as told.  Get regular exercise. Ask your health care provider how much and what kind of exercise is best for you. In general, you should do moderate exercise for at least 30 minutes on most days of the week (150 minutes each week). This can include activities such as walking, biking, or yoga.  Go to the bathroom when you have the urge to have a bowel movement. Do not wait.  Avoid straining to have bowel movements.  Keep the anal area dry and clean. Use wet toilet paper or moist towelettes after a bowel movement.  Do not sit on the toilet for long periods of time. This increases blood pooling and pain.  Keep all follow-up visits as told by your health care provider. This is important. Contact a health care  provider if you have:  Increasing pain and swelling that are not controlled by treatment or medicine.  Difficulty having a bowel movement, or you are unable to have a bowel movement.  Pain or inflammation outside the area of the hemorrhoids. Get help right away if you have:  Uncontrolled bleeding from your rectum. Summary  Hemorrhoids are swollen veins in and around the rectum or anus.  Most hemorrhoids can be managed with home treatments such as diet and lifestyle changes.  Taking warm sitz baths can help ease pain and discomfort.  In severe cases, procedures or surgery can be done to shrink or remove the hemorrhoids. This information is not intended to replace advice given to you by your health care provider. Make sure you discuss any questions you have with your health care provider. Document Revised: 02/07/2019 Document Reviewed: 01/31/2018 Elsevier Patient Education  2021 [place].  Diverticulosis  Diverticulosis is a condition that develops when small pouches (diverticula) form in the wall of the large intestine (colon). The colon is where water is absorbed and stool (feces) is formed. The pouches form when the inside layer of the colon pushes through weak spots in the outer layers of the colon. You may have a few pouches or many of them. The pouches usually do not cause problems unless they become inflamed or infected. When this happens, the condition is called diverticulitis. What are the causes? The cause of this condition is not known. What increases the risk? The following factors may make you more likely to develop this condition:  Being older than age 14. Your risk for this condition increases with age. Diverticulosis is rare among people younger than age 40. By age 5, many people have it.  Eating a low-fiber diet.  Having frequent constipation.  Being overweight.  Not getting enough exercise.  Smoking.  Taking over-the-counter pain medicines, like  aspirin and ibuprofen.  Having a family history of diverticulosis. What are the signs or symptoms? In most people, there are no symptoms of this condition. If you do have symptoms, they may include:  Bloating.  Cramps in the abdomen.  Constipation or diarrhea.  Pain in the lower left side of the abdomen. How is this diagnosed? Because diverticulosis usually has no symptoms, it is most often diagnosed during an exam for other colon problems.  The condition may be diagnosed by:  Using a flexible scope to examine the colon (colonoscopy).  Taking an X-ray of the colon after dye has been put into the colon (barium enema).  Having a CT scan. How is this treated? You may not need treatment for this condition. Your health care provider may recommend treatment to prevent problems. You may need treatment if you have symptoms or if you previously had diverticulitis. Treatment may include:  Eating a high-fiber diet.  Taking a fiber supplement.  Taking a live bacteria supplement (probiotic).  Taking medicine to relax your colon.   Follow these instructions at home: Medicines  Take over-the-counter and prescription medicines only as told by your health care provider.  If told by your health care provider, take a fiber supplement or probiotic. Constipation prevention Your condition may cause constipation. To prevent or treat constipation, you may need to:  Drink enough fluid to keep your urine pale yellow.  Take over-the-counter or prescription medicines.  Eat foods that are high in fiber, such as beans, whole grains, and fresh fruits and vegetables.  Limit foods that are high in fat and processed sugars, such as fried or sweet foods.   General instructions  Try not to strain when you have a bowel movement.  Keep all follow-up visits as told by your health care provider. This is important. Contact a health care provider if you:  Have pain in your abdomen.  Have  bloating.  Have cramps.  Have not had a bowel movement in 3 days. Get help right away if:  Your pain gets worse.  Your bloating becomes very bad.  You have a fever or chills, and your symptoms suddenly get worse.  You vomit.  You have bowel movements that are bloody or black.  You have bleeding from your rectum. Summary  Diverticulosis is a condition that develops when small pouches (diverticula) form in the wall of the large intestine (colon).  You may have a few pouches or many of them.  This condition is most often diagnosed during an exam for other colon problems.  Treatment may include increasing the fiber in your diet, taking supplements, or taking medicines. This information is not intended to replace advice given to you by your health care provider. Make sure you discuss any questions you have with your health care provider. Document Revised: 04/10/2019 Document Reviewed: 04/10/2019 Elsevier Patient Education  [place].  Colon Polyps  Colon polyps are tissue growths inside the colon, which is part of the large intestine. They are one of the types of polyps that can grow in the body. A polyp may be a round bump or a mushroom-shaped growth. You could have one polyp or more than one. Most colon polyps are noncancerous (benign). However, some colon polyps can become cancerous over time. Finding and removing the polyps early can help prevent this. What are the causes? The exact cause of colon polyps is not known. What increases the risk? The following factors may make you more likely to develop this condition:  Having a family history of colorectal cancer or colon polyps.  Being older than 70 years of age.  Being younger than 70 years of age and having a significant family history of colorectal cancer or colon polyps or a genetic condition that puts you at higher risk of getting colon polyps.  Having inflammatory bowel disease, such as ulcerative colitis or  Crohn's disease.  Having certain conditions passed from parent to child (hereditary conditions),  such as: ? Familial adenomatous polyposis (FAP). ? Lynch syndrome. ? Turcot syndrome. ? Peutz-Jeghers syndrome. ? MUTYH-associated polyposis (MAP).  Being overweight.  Certain lifestyle factors. These include smoking cigarettes, drinking too much alcohol, not getting enough exercise, and eating a diet that is high in fat and red meat and low in fiber.  Having had childhood cancer that was treated with radiation of the abdomen. What are the signs or symptoms? Many times, there are no symptoms. If you have symptoms, they may include:  Blood coming from the rectum during a bowel movement.  Blood in the stool (feces). The blood may be bright red or very dark in color.  Pain in the abdomen.  A change in bowel habits, such as constipation or diarrhea. How is this diagnosed? This condition is diagnosed with a colonoscopy. This is a procedure in which a lighted, flexible scope is inserted into the opening between the buttocks (anus) and then passed into the colon to examine the area. Polyps are sometimes found when a colonoscopy is done as part of routine cancer screening tests. How is this treated? This condition is treated by removing any polyps that are found. Most polyps can be removed during a colonoscopy. Those polyps will then be tested for cancer. Additional treatment may be needed depending on the results of testing. Follow these instructions at home: Eating and drinking  Eat foods that are high in fiber, such as fruits, vegetables, and whole grains.  Eat foods that are high in calcium and vitamin D, such as milk, cheese, yogurt, eggs, liver, fish, and broccoli.  Limit foods that are high in fat, such as fried foods and desserts.  Limit the amount of red meat, precooked or cured meat, or other processed meat that you eat, such as hot dogs, sausages, bacon, or meat loaves.  Limit  sugary drinks.   Lifestyle  Maintain a healthy weight, or lose weight if recommended by your health care provider.  Exercise every day or as told by your health care provider.  Do not use any products that contain nicotine or tobacco, such as cigarettes, e-cigarettes, and chewing tobacco. If you need help quitting, ask your health care provider.  Do not drink alcohol if: ? Your health care provider tells you not to drink. ? You are pregnant, may be pregnant, or are planning to become pregnant.  If you drink alcohol: ? Limit how much you use to:  0-1 drink a day for women.  0-2 drinks a day for men. ? Know how much alcohol is in your drink. In the [place] one drink equals one 12 oz bottle of beer (355 mL), one 5 oz glass of wine (148 mL), or one 1 oz glass of hard liquor (44 mL). General instructions  Take over-the-counter and prescription medicines only as told by your health care provider.  Keep all follow-up visits. This is important. This includes having regularly scheduled colonoscopies. Talk to your health care provider about when you need a colonoscopy. Contact a health care provider if:  You have new or worsening bleeding during a bowel movement.  You have new or increased blood in your stool.  You have a change in bowel habits.  You lose weight for no known reason. Summary  Colon polyps are tissue growths inside the colon, which is part of the large intestine. They are one type of polyp that can grow in the body.  Most colon polyps are noncancerous (benign), but some can become cancerous over time.  This condition is diagnosed with a colonoscopy.  This condition is treated by removing any polyps that are found. Most polyps can be removed during a colonoscopy. This information is not intended to replace advice given to you by your health care provider. Make sure you discuss any questions you have with your health care provider. Document Revised: 12/31/2019 Document  Reviewed: 12/31/2019 Elsevier Patient Education  2021 [employer].

## 2021-01-20 NOTE — Progress Notes (Signed)
Letter mailed

## 2021-01-21 ENCOUNTER — Encounter (INDEPENDENT_AMBULATORY_CARE_PROVIDER_SITE_OTHER): Payer: Self-pay | Admitting: *Deleted

## 2021-01-21 LAB — SURGICAL PATHOLOGY

## 2021-01-24 ENCOUNTER — Encounter (HOSPITAL_COMMUNITY): Payer: Self-pay | Admitting: Internal Medicine

## 2021-01-24 DIAGNOSIS — H401113 Primary open-angle glaucoma, right eye, severe stage: Secondary | ICD-10-CM | POA: Diagnosis not present

## 2021-01-24 DIAGNOSIS — H401121 Primary open-angle glaucoma, left eye, mild stage: Secondary | ICD-10-CM | POA: Diagnosis not present

## 2021-01-28 ENCOUNTER — Encounter (INDEPENDENT_AMBULATORY_CARE_PROVIDER_SITE_OTHER): Payer: Self-pay | Admitting: *Deleted

## 2021-02-18 ENCOUNTER — Encounter: Payer: Self-pay | Admitting: Internal Medicine

## 2021-02-23 ENCOUNTER — Other Ambulatory Visit: Payer: Self-pay

## 2021-02-23 ENCOUNTER — Telehealth (INDEPENDENT_AMBULATORY_CARE_PROVIDER_SITE_OTHER): Payer: PPO | Admitting: Internal Medicine

## 2021-02-23 ENCOUNTER — Encounter: Payer: Self-pay | Admitting: Internal Medicine

## 2021-02-23 ENCOUNTER — Other Ambulatory Visit: Payer: Self-pay | Admitting: Internal Medicine

## 2021-02-23 VITALS — Ht 70.0 in | Wt 167.0 lb

## 2021-02-23 DIAGNOSIS — R0600 Dyspnea, unspecified: Secondary | ICD-10-CM

## 2021-02-23 DIAGNOSIS — R0609 Other forms of dyspnea: Secondary | ICD-10-CM

## 2021-02-23 DIAGNOSIS — J302 Other seasonal allergic rhinitis: Secondary | ICD-10-CM | POA: Insufficient documentation

## 2021-02-23 MED ORDER — MONTELUKAST SODIUM 10 MG PO TABS
10.0000 mg | ORAL_TABLET | Freq: Every day | ORAL | 0 refills | Status: DC
Start: 2021-02-23 — End: 2021-02-23

## 2021-02-23 NOTE — Patient Instructions (Signed)
Please get EKG in the office tomorrow.  Please start taking Singulair for allergies. Continue to use Nasacort for allergies as well.

## 2021-02-23 NOTE — Assessment & Plan Note (Signed)
Added Singulair Continue Nasacort

## 2021-02-23 NOTE — Progress Notes (Signed)
Virtual Visit via Telephone Note   This visit type was conducted due to national recommendations for restrictions regarding the COVID-19 Pandemic (e.g. social distancing) in an effort to limit this patient's exposure and mitigate transmission in our community.  Due to his co-morbid illnesses, this patient is at least at moderate risk for complications without adequate follow up.  This format is felt to be most appropriate for this patient at this time.  The patient did not have access to video technology/had technical difficulties with video requiring transitioning to audio format only (telephone).  All issues noted in this document were discussed and addressed.  No physical exam could be performed with this format.  Please refer to the patient's chart for his  consent to telehealth for [facility].   Evaluation Performed:  Follow-up visit  Date:  02/23/2021   ID:  Jonathan Dean, DOB Apr 10, 1951, MRN [id number]  Patient Location: Home Provider Location: Office/Clinic  Location of Patient: Home Location of Provider: Telehealth Consent was obtain for visit to be over via telehealth. I verified that I am speaking with the correct person using two identifiers.  PCP:  Lindell Spar, MD   Chief Complaint:  Dyspnea on exertion  History of Present Illness:    Jonathan Dean is a 70 y.o. male who has a televisit for c/o dyspnea on exertion. Denies any fever, chills. But he has been having chronic cough especially during walking. He had Pulmonology visit and was told that his lungs are clear. He denies any chest pain or palpitations. He has been using Nasacort with some relief. He also uses Albuterol inhaler for dyspnea with moderate relief.  The patient does not have symptoms concerning for COVID-19 infection (fever, chills, cough, or new shortness of breath).   Past Medical, Surgical, Social History, Allergies, and Medications have been Reviewed.  Past Medical History:  Diagnosis  Date  . ERECTILE DYSFUNCTION 06/30/2009   Qualifier: Diagnosis of  By: Andria Frames MD, Gwyndolyn Saxon    . Glaucoma   . Hyperlipidemia   . Polyp of colon, adenomatous 09/15/2011   Colonoscopy 11/07/11 removed at least two adenomatous polyps confirmed by surgical path.     Past Surgical History:  Procedure Laterality Date  . COLONOSCOPY N/A 01/20/2021   Procedure: COLONOSCOPY;  Surgeon: Rogene Houston, MD;  Location: [facility];  Service: Endoscopy;  Laterality: N/A;  am  . POLYPECTOMY  01/20/2021   Procedure: POLYPECTOMY;  Surgeon: Rogene Houston, MD;  Location: [facility];  Service: Endoscopy;;     Current Meds  Medication Sig  . albuterol (VENTOLIN HFA) 108 (90 Base) MCG/ACT inhaler INHALE 2 PUFFS INTO THE LUNGS EVERY 6 HOURS AS NEEDED FOR WHEEZING OR SHORTNESS OF BREATH (Patient taking differently: Inhale 2 puffs into the lungs every 6 (six) hours as needed for wheezing or shortness of breath.)  . dorzolamide-timolol (COSOPT) 22.3-6.8 MG/ML ophthalmic solution Place 1 drop into both eyes 2 (two) times daily.  Marland Kitchen latanoprost (XALATAN) 0.005 % ophthalmic solution Place 1 drop into both eyes at bedtime.  . montelukast (SINGULAIR) 10 MG tablet Take 1 tablet (10 mg total) by mouth at bedtime.  . Multiple Vitamins-Minerals (MULTIVITAMIN WITH MINERALS) tablet Take 1 tablet by mouth as needed.     Allergies:   Dorzolamide hcl-timolol mal   ROS:   Please see the history of present illness.     All other systems reviewed and are negative.   Labs/Other Tests and Data Reviewed:  Recent Labs: 11/22/2020: ALT 40; Hemoglobin 14.9; Platelets 255; TSH 2.720 12/13/2020: B Natriuretic Peptide 78.0; BUN 13; Creatinine, Ser 1.09; Potassium 4.1; Sodium 136   Recent Lipid Panel Lab Results  Component Value Date/Time   CHOL 249 (H) 11/22/2020 08:53 AM   TRIG 146 11/22/2020 08:53 AM   HDL 61 11/22/2020 08:53 AM   CHOLHDL 4.1 11/22/2020 08:53 AM   CHOLHDL 4.2 10/04/2012 09:25 AM   LDLCALC 162 (H)  11/22/2020 08:53 AM    Wt Readings from Last 3 Encounters:  02/23/21 167 lb (75.8 kg)  01/11/21 174 lb (78.9 kg)  12/15/20 170 lb 12.8 oz (77.5 kg)      ASSESSMENT & PLAN:    DOE (dyspnea on exertion) Unclear etiology Better with Albuterol inhaler Pulmonology evaluation done, no PFTs so far If persistent, will discuss with Pulmonology for possible evaluation with PFTs Added Singulair for allergic symptoms No baseline EKG in the chart, will check it in the office tomorrow  Seasonal allergies Added Singulair Continue Nasacort   Time:   Today, I have spent 15 minutes reviewing the chart, including problem list, medications, and with the patient with telehealth technology discussing the above problems.   Medication Adjustments/Labs and Tests Ordered: Current medicines are reviewed at length with the patient today.  Concerns regarding medicines are outlined above.   Tests Ordered: No orders of the defined types were placed in this encounter.   Medication Changes: Meds ordered this encounter  Medications  . montelukast (SINGULAIR) 10 MG tablet    Sig: Take 1 tablet (10 mg total) by mouth at bedtime.    Dispense:  30 tablet    Refill:  0     Note: This dictation was prepared with Dragon dictation along with smaller phrase technology. Similar sounding words can be transcribed inadequately or may not be corrected upon review. Any transcriptional errors that result from this process are unintentional.      Disposition:  Follow up  Signed, Lindell Spar, MD  02/23/2021 4:12 PM     [facility]

## 2021-02-23 NOTE — Assessment & Plan Note (Signed)
Unclear etiology Better with Albuterol inhaler Pulmonology evaluation done, no PFTs so far If persistent, will discuss with Pulmonology for possible evaluation with PFTs Added Singulair for allergic symptoms No baseline EKG in the chart, will check it in the office tomorrow

## 2021-02-24 ENCOUNTER — Other Ambulatory Visit: Payer: Self-pay

## 2021-02-24 ENCOUNTER — Ambulatory Visit: Payer: PPO

## 2021-04-01 ENCOUNTER — Other Ambulatory Visit: Payer: Self-pay | Admitting: Internal Medicine

## 2021-04-01 DIAGNOSIS — R0600 Dyspnea, unspecified: Secondary | ICD-10-CM

## 2021-04-01 DIAGNOSIS — R0609 Other forms of dyspnea: Secondary | ICD-10-CM

## 2021-05-19 ENCOUNTER — Encounter: Payer: Self-pay | Admitting: Internal Medicine

## 2021-05-19 NOTE — Telephone Encounter (Signed)
Appt scheduled for Monday, 8/29 with Pearline Cables

## 2021-05-23 ENCOUNTER — Ambulatory Visit (INDEPENDENT_AMBULATORY_CARE_PROVIDER_SITE_OTHER): Payer: PPO | Admitting: Nurse Practitioner

## 2021-05-23 ENCOUNTER — Other Ambulatory Visit: Payer: Self-pay

## 2021-05-23 ENCOUNTER — Encounter: Payer: Self-pay | Admitting: Nurse Practitioner

## 2021-05-23 ENCOUNTER — Telehealth: Payer: Self-pay | Admitting: Internal Medicine

## 2021-05-23 VITALS — BP 158/84 | HR 73 | Temp 98.6°F | Ht 70.0 in | Wt 160.0 lb

## 2021-05-23 DIAGNOSIS — J40 Bronchitis, not specified as acute or chronic: Secondary | ICD-10-CM

## 2021-05-23 DIAGNOSIS — R053 Chronic cough: Secondary | ICD-10-CM | POA: Diagnosis not present

## 2021-05-23 MED ORDER — GUAIFENESIN 200 MG PO TABS: 200.0000 mg | ORAL_TABLET | ORAL | 2 refills | Status: DC | PRN

## 2021-05-23 NOTE — Progress Notes (Signed)
Acute Office Visit  Subjective:    Patient ID: Jonathan Dean, male    DOB: Apr 02, 1951, 70 y.o.   MRN: [id number]  Chief Complaint  Patient presents with   Cough    Cough as well as shortness of breath, ongoing x1 week. Productive cough mainly in the morning. Shortness of breath only when active.     Cough Pertinent negatives include no shortness of breath or wheezing.  Patient is in today for cough that is worse in the AM. He was treated for this in January with augmentin and steroids x2 weeks. He was started on breo at that time, but is no longer taking that.  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      Past Surgical History:  Procedure Laterality Date   COLONOSCOPY N/A 01/20/2021   Procedure: COLONOSCOPY;  Surgeon: Rogene Houston, MD;  [facility];  Service: Endoscopy;  Laterality: N/A;  am   POLYPECTOMY  01/20/2021   Procedure: POLYPECTOMY;  Surgeon: Rogene Houston, MD;  Location: [facility];  Service: Endoscopy;;    History reviewed. No pertinent family history.  Social History   Socioeconomic History   Marital status: Married    Spouse name: Not on file   Number of children: Not on file   Years of education: Not on file   Highest education level: Not on file  Occupational History   Not on file  Tobacco Use   Smoking status: Former    Packs/day: 1.50    Years: 25.00    Pack years: 37.50    Types: Cigarettes    Quit date: 09/14/2005    Years since quitting: 15.6   Smokeless tobacco: Never  Vaping Use   Vaping Use: Never used  Substance and Sexual Activity   Alcohol use: Yes    Alcohol/week: 2.0 standard drinks    Types: 2 Standard drinks or equivalent per week   Drug use: No   Sexual  activity: Yes    Partners: Female    Comment: monogamous  Other Topics Concern   Not on file  Social History Narrative   Not on file   Social Determinants of Health   Financial Resource Strain: Not on file  Food Insecurity: Not on file  Transportation Needs: Not on file  Physical Activity: Not on file  Stress: Not on file  Social Connections: Not on file  Intimate Partner Violence: Not on file    Outpatient Medications Prior to Visit  Medication Sig Dispense Refill   albuterol (VENTOLIN HFA) 108 (90 Base) MCG/ACT inhaler INHALE 2 PUFFS INTO THE LUNGS EVERY 6 HOURS AS NEEDED FOR WHEEZING OR SHORTNESS OF BREATH (Patient taking differently: Inhale 2 puffs into the lungs every 6 (six) hours as needed for wheezing or shortness of breath.) 6.7 g 2   dorzolamide-timolol (COSOPT) 22.3-6.8 MG/ML ophthalmic solution Place 1 drop into both eyes 2 (two) times daily.     latanoprost (XALATAN) 0.005 % ophthalmic solution Place 1 drop into both eyes at bedtime.     montelukast (SINGULAIR) 10 MG tablet TAKE 1 TABLET(10 MG) BY MOUTH AT BEDTIME 90 tablet 3   Multiple Vitamins-Minerals (MULTIVITAMIN WITH MINERALS) tablet Take 1 tablet by mouth as needed.  No facility-administered medications prior to visit.    Allergies  Allergen Reactions   Dorzolamide Hcl-Timolol Mal Other (See Comments)    Swelling of eye lids only the [employer] brand of cosopt drug store is aware of this problem    Review of Systems  Constitutional: Negative.   HENT: Negative.    Respiratory:  Positive for cough. Negative for shortness of breath and wheezing.        Productive; ongoing since January  Cardiovascular: Negative.       Objective:    Physical Exam Constitutional:      Appearance: Normal appearance.  Cardiovascular:     Rate and Rhythm: Normal rate and regular rhythm.     Pulses: Normal pulses.     Heart sounds: Normal heart sounds.  Pulmonary:     Effort: Pulmonary effort is normal.     Comments:  Diminished breath sounds Neurological:     Mental Status: He is alert.    BP (!) 158/84 (BP Location: Left Arm, Patient Position: Sitting, Cuff Size: Large)   Pulse 73   Temp 98.6 F (37 C) (Oral)   Ht $R'5\' 10"'Ce$  (1.778 m)   Wt 160 lb (72.6 kg)   SpO2 95%   BMI 22.96 kg/m  Wt Readings from Last 3 Encounters:  05/23/21 160 lb (72.6 kg)  02/23/21 167 lb (75.8 kg)  01/11/21 174 lb (78.9 kg)    Health Maintenance Due  Topic Date Due   Hepatitis C Screening  Never done   INFLUENZA VACCINE  04/25/2021    There are no preventive care reminders to display for this patient.   Lab Results  Component Value Date   TSH 2.720 11/22/2020   Lab Results  Component Value Date   WBC 5.8 11/22/2020   HGB 14.9 11/22/2020   HCT 43.6 11/22/2020   MCV 93 11/22/2020   PLT 255 11/22/2020   Lab Results  Component Value Date   NA 136 12/13/2020   K 4.1 12/13/2020   CO2 27 12/13/2020   GLUCOSE 141 (H) 12/13/2020   BUN 13 12/13/2020   CREATININE 1.09 12/13/2020   BILITOT 0.4 11/22/2020   ALKPHOS 68 11/22/2020   AST 26 11/22/2020   ALT 40 11/22/2020   PROT 7.0 11/22/2020   ALBUMIN 4.2 11/22/2020   CALCIUM 9.9 12/13/2020   ANIONGAP 11 12/13/2020   EGFR 73 11/22/2020   Lab Results  Component Value Date   CHOL 249 (H) 11/22/2020   Lab Results  Component Value Date   HDL 61 11/22/2020   Lab Results  Component Value Date   LDLCALC 162 (H) 11/22/2020   Lab Results  Component Value Date   TRIG 146 11/22/2020   Lab Results  Component Value Date   CHOLHDL 4.1 11/22/2020   Lab Results  Component Value Date   HGBA1C 5.8 (H) 11/22/2020       Assessment & Plan:   Problem List Items Addressed This Visit       Respiratory   Bronchitis    -ongoing x 7 months -will get repeat CXR and sputum culture since he brought in a sample; not likely infectious given his lack of fever, fatigue, and fact that it has persisted for this long -he was a smoker x40 years and was up to 2  ppd -likely COPD- chronic bronchitis -referral to pulmonology; would appreciate pulmonary function tests in addition to any other work-up that they deem necessary      Other Visit Diagnoses  Persistent cough    -  Primary   Relevant Medications   guaiFENesin 200 MG tablet   Other Relevant Orders   Respiratory or Resp and Sputum Culture   DG Chest 2 View   Ambulatory referral to Pulmonology        Meds ordered this encounter  Medications   guaiFENesin 200 MG tablet    Sig: Take 1 tablet (200 mg total) by mouth every 4 (four) hours as needed for cough or to loosen phlegm.    Dispense:  30 tablet    Refill:  2     Noreene Larsson, NP

## 2021-05-23 NOTE — Telephone Encounter (Signed)
Dr Melvyn Novas are you okay with pt seeing a different provider?

## 2021-05-23 NOTE — Addendum Note (Signed)
Addended by: Demetrius Revel on: 05/23/2021 01:25 PM   Modules accepted: Orders

## 2021-05-23 NOTE — Assessment & Plan Note (Signed)
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

## 2021-05-24 ENCOUNTER — Ambulatory Visit (HOSPITAL_COMMUNITY)
Admission: RE | Admit: 2021-05-24 | Discharge: 2021-05-24 | Disposition: A | Discharge status: Home / Self Care | Payer: PPO | Source: Ambulatory Visit | Attending: Nurse Practitioner | Admitting: Nurse Practitioner

## 2021-05-24 ENCOUNTER — Encounter: Payer: Self-pay | Admitting: Nurse Practitioner

## 2021-05-24 DIAGNOSIS — R053 Chronic cough: Secondary | ICD-10-CM | POA: Insufficient documentation

## 2021-05-24 DIAGNOSIS — J449 Chronic obstructive pulmonary disease, unspecified: Secondary | ICD-10-CM | POA: Diagnosis not present

## 2021-05-24 DIAGNOSIS — J439 Emphysema, unspecified: Secondary | ICD-10-CM | POA: Diagnosis not present

## 2021-05-24 DIAGNOSIS — R059 Cough, unspecified: Secondary | ICD-10-CM | POA: Diagnosis not present

## 2021-05-24 MED ORDER — GUAIFENESIN 200 MG PO TABS: 200.0000 mg | ORAL_TABLET | ORAL | 2 refills | Status: DC | PRN

## 2021-05-24 NOTE — Telephone Encounter (Signed)
Tanda Rockers, MD  You 17 hours ago (6:28 PM)   Fine With me though note his last visit says follow up PRN because he was doing so well so not clear what the problem is. Please forward to the PCP a copy of this phone message    Tried calling the pt and there was no answer and his VM box not set up yet, WCB.

## 2021-05-25 MED ORDER — GUAIFENESIN 200 MG PO TABS: 200.0000 mg | ORAL_TABLET | ORAL | 2 refills | Status: AC | PRN

## 2021-05-25 NOTE — Telephone Encounter (Signed)
Tried calling pt again and there was no answer and his VM was not set up yet. Closing encounter per protocol.

## 2021-05-25 NOTE — Progress Notes (Signed)
His chest x-ray showed emphysematous and bronchitic changes consistent with COPD, so his cough is from COPD. The sputum culture grew routine respiratory bacteria, so no infection.

## 2021-05-25 NOTE — Addendum Note (Signed)
Addended by: Laretta Bolster on: 05/25/2021 10:43 AM   Modules accepted: Orders

## 2021-05-26 ENCOUNTER — Encounter: Payer: Self-pay | Admitting: Nurse Practitioner

## 2021-05-27 DIAGNOSIS — H401121 Primary open-angle glaucoma, left eye, mild stage: Secondary | ICD-10-CM | POA: Diagnosis not present

## 2021-05-27 DIAGNOSIS — H401113 Primary open-angle glaucoma, right eye, severe stage: Secondary | ICD-10-CM | POA: Diagnosis not present

## 2021-05-27 LAB — SPUTUM CULTURE

## 2021-05-27 LAB — GRAM STAIN W/SPUTUM CULT RFLX

## 2021-05-27 NOTE — Progress Notes (Signed)
I think you called him yesterday, but no pathogenic bacterial growth on final result of gram stain and sputum culture.

## 2021-06-06 NOTE — Telephone Encounter (Signed)
He can go to the emergency department for rapid imaging if he wants to be seen today, or we can set him up for an acute appointment to make sure he doesn't have a new infection. We can send him to another pulmonologist if they have quicker openings, or we can see if he can be seen with Dr.Sood sooner.

## 2021-06-07 ENCOUNTER — Other Ambulatory Visit: Payer: Self-pay | Admitting: Nurse Practitioner

## 2021-06-07 DIAGNOSIS — R053 Chronic cough: Secondary | ICD-10-CM

## 2021-06-07 MED ORDER — FLUTICASONE FUROATE-VILANTEROL 100-25 MCG/INH IN AEPB: 1.0000 | INHALATION_SPRAY | Freq: Every day | RESPIRATORY_TRACT | 11 refills | Status: DC

## 2021-06-07 NOTE — Telephone Encounter (Signed)
I sent in [place]. It looks like this may be expensive with his insurance. If it is cost prohibitive (may not be if he used it previously), let us know, and I may set him up with out pharmacist who works wonders with making medications more affordable.

## 2021-06-08 ENCOUNTER — Encounter: Payer: Self-pay | Admitting: Nurse Practitioner

## 2021-06-08 ENCOUNTER — Other Ambulatory Visit: Payer: Self-pay

## 2021-06-08 ENCOUNTER — Ambulatory Visit (INDEPENDENT_AMBULATORY_CARE_PROVIDER_SITE_OTHER): Payer: PPO | Admitting: Nurse Practitioner

## 2021-06-08 VITALS — BP 170/86 | HR 76 | Temp 98.1°F | Ht 70.0 in | Wt 168.0 lb

## 2021-06-08 DIAGNOSIS — J209 Acute bronchitis, unspecified: Secondary | ICD-10-CM

## 2021-06-08 DIAGNOSIS — J449 Chronic obstructive pulmonary disease, unspecified: Secondary | ICD-10-CM | POA: Insufficient documentation

## 2021-06-08 DIAGNOSIS — J440 Chronic obstructive pulmonary disease with acute lower respiratory infection: Secondary | ICD-10-CM

## 2021-06-08 MED ORDER — BREZTRI AEROSPHERE 160-9-4.8 MCG/ACT IN AERO
2.0000 | INHALATION_SPRAY | Freq: Two times a day (BID) | RESPIRATORY_TRACT | 11 refills | Status: DC
Start: 2021-06-08 — End: 2022-05-22

## 2021-06-08 NOTE — Progress Notes (Signed)
Acute Office Visit  Subjective:    Patient ID: Jonathan Dean, male    DOB: 11-23-1950, 70 y.o.   MRN: [id number]  Chief Complaint  Patient presents with   Cough    Persistent productive cough, has been ongoing for 9 months. Does not feel sick, but does get shortness of breath on exertion.     Cough Associated symptoms include shortness of breath.  Patient is in today for persistent cough x 9 months. CXR showed COPD. Sputum culture grew routine respiratory bacteria. He states he get SOB with ambulating long distances and in high heat/humidity. He used Breo previously, and that helped.  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      Past Surgical History:  Procedure Laterality Date   COLONOSCOPY N/A 01/20/2021   Procedure: COLONOSCOPY;  Surgeon: Rogene Houston, MD;  [facility];  Service: Endoscopy;  Laterality: N/A;  am   POLYPECTOMY  01/20/2021   Procedure: POLYPECTOMY;  Surgeon: Rogene Houston, MD;  [facility];  Service: Endoscopy;;    History reviewed. No pertinent family history.  Social History   Socioeconomic History   Marital status: Married    Spouse name: Not on file   Number of children: Not on file   Years of education: Not on file   Highest education level: Not on file  Occupational History   Not on file  Tobacco Use   Smoking status: Former    Packs/day: 1.50    Years: 25.00    Pack years: 37.50    Types: Cigarettes    Quit date: 09/14/2005    Years since quitting: 15.7   Smokeless tobacco: Never  Vaping Use   Vaping Use: Never used  Substance and Sexual Activity   Alcohol use: Yes    Alcohol/week: 2.0 standard drinks    Types: 2 Standard drinks or equivalent per week    Drug use: No   Sexual activity: Yes    Partners: Female    Comment: monogamous  Other Topics Concern   Not on file  Social History Narrative   Not on file   Social Determinants of Health   Financial Resource Strain: Not on file  Food Insecurity: Not on file  Transportation Needs: Not on file  Physical Activity: Not on file  Stress: Not on file  Social Connections: Not on file  Intimate Partner Violence: Not on file    Outpatient Medications Prior to Visit  Medication Sig Dispense Refill   albuterol (VENTOLIN HFA) 108 (90 Base) MCG/ACT inhaler INHALE 2 PUFFS INTO THE LUNGS EVERY 6 HOURS AS NEEDED FOR WHEEZING OR SHORTNESS OF BREATH (Patient taking differently: Inhale 2 puffs into the lungs every 6 (six) hours as needed for wheezing or shortness of breath.) 6.7 g 2   dorzolamide-timolol (COSOPT) 22.3-6.8 MG/ML ophthalmic solution Place 1 drop into both eyes 2 (two) times daily.     guaiFENesin 200 MG tablet Take 1 tablet (200 mg total) by mouth every 4 (four) hours as needed for cough or to loosen phlegm. 30 tablet 2   latanoprost (XALATAN) 0.005 % ophthalmic solution Place 1 drop into both eyes at bedtime.     montelukast (SINGULAIR) 10 MG tablet TAKE  1 TABLET(10 MG) BY MOUTH AT BEDTIME 90 tablet 3   Multiple Vitamins-Minerals (MULTIVITAMIN WITH MINERALS) tablet Take 1 tablet by mouth as needed.     fluticasone furoate-vilanterol (BREO ELLIPTA) 100-25 MCG/INH AEPB Inhale 1 puff into the lungs daily. (Patient not taking: Reported on 06/08/2021) 1 each 11   No facility-administered medications prior to visit.    Allergies  Allergen Reactions   Dorzolamide Hcl-Timolol Mal Other (See Comments)    Swelling of eye lids only the [employer] brand of cosopt drug store is aware of this problem    Review of Systems  Constitutional: Negative.   Respiratory:  Positive for cough and shortness of breath.        With exertion; states home O2 dropped to 86% with ambulation      Objective:     Physical Exam Constitutional:      Appearance: Normal appearance.  Cardiovascular:     Rate and Rhythm: Normal rate and regular rhythm.     Pulses: Normal pulses.     Heart sounds: Normal heart sounds.  Pulmonary:     Effort: Pulmonary effort is normal.     Comments: Diminished breath sounds Neurological:     Mental Status: He is alert.  Psychiatric:        Mood and Affect: Mood normal.        Behavior: Behavior normal.        Thought Content: Thought content normal.        Judgment: Judgment normal.    BP (!) 170/86 (BP Location: Left Arm, Patient Position: Sitting, Cuff Size: Normal)   Pulse 76   Temp 98.1 F (36.7 C) (Oral)   Ht _0  (1.778 m)   Wt 168 lb (76.2 kg)   SpO2 95%   BMI 24.11 kg/m  Wt Readings from Last 3 Encounters:  06/08/21 168 lb (76.2 kg)  05/23/21 160 lb (72.6 kg)  02/23/21 167 lb (75.8 kg)    Health Maintenance Due  Topic Date Due   Hepatitis C Screening  Never done    There are no preventive care reminders to display for this patient.   Lab Results  Component Value Date   TSH 2.720 11/22/2020   Lab Results  Component Value Date   WBC 5.8 11/22/2020   HGB 14.9 11/22/2020   HCT 43.6 11/22/2020   MCV 93 11/22/2020   PLT 255 11/22/2020   Lab Results  Component Value Date   NA 136 12/13/2020   K 4.1 12/13/2020   CO2 27 12/13/2020   GLUCOSE 141 (H) 12/13/2020   BUN 13 12/13/2020   CREATININE 1.09 12/13/2020   BILITOT 0.4 11/22/2020   ALKPHOS 68 11/22/2020   AST 26 11/22/2020   ALT 40 11/22/2020   PROT 7.0 11/22/2020   ALBUMIN 4.2 11/22/2020   CALCIUM 9.9 12/13/2020   ANIONGAP 11 12/13/2020   EGFR 73 11/22/2020   Lab Results  Component Value Date   CHOL 249 (H) 11/22/2020   Lab Results  Component Value Date   HDL 61 11/22/2020   Lab Results  Component Value Date   LDLCALC 162 (H) 11/22/2020   Lab Results  Component Value Date   TRIG 146 11/22/2020   Lab Results  Component Value Date   CHOLHDL 4.1 11/22/2020    Lab Results  Component Value Date   HGBA1C 5.8 (H) 11/22/2020       Assessment & Plan:   Problem List Items Addressed This Visit  Respiratory   COPD (chronic obstructive pulmonary disease) ([place])    -confirmed with recent CXR -Breo helped in the past, but this isn't on his formulary -STOP Breo d/t cost -Rx breztri -he will see pulmonology in 2 months -he states home O2 sat drops to 86% with ambulation; considered home O2, but he declined and wants maintenance COPD medications      Relevant Medications   Budeson-Glycopyrrol-Formoterol (BREZTRI AEROSPHERE) 160-9-4.8 MCG/ACT AERO   Other Visit Diagnoses     Acute bronchitis with COPD ([place])    -  Primary   Relevant Medications   Budeson-Glycopyrrol-Formoterol (BREZTRI AEROSPHERE) 160-9-4.8 MCG/ACT AERO        Meds ordered this encounter  Medications   Budeson-Glycopyrrol-Formoterol (BREZTRI AEROSPHERE) 160-9-4.8 MCG/ACT AERO    Sig: Inhale 2 puffs into the lungs 2 (two) times daily.    Dispense:  10.7 g    Refill:  [place], NP

## 2021-06-08 NOTE — Assessment & Plan Note (Addendum)
-  confirmed with recent CXR -Breo helped in the past, but this isn't on his formulary -STOP Breo d/t cost -Rx breztri -he will see pulmonology in 2 months -he states home O2 sat drops to 86% with ambulation; considered home O2, but he declined and wants maintenance COPD medications

## 2021-06-16 ENCOUNTER — Other Ambulatory Visit: Payer: Self-pay

## 2021-06-16 ENCOUNTER — Encounter: Payer: Self-pay | Admitting: Internal Medicine

## 2021-06-16 ENCOUNTER — Ambulatory Visit (INDEPENDENT_AMBULATORY_CARE_PROVIDER_SITE_OTHER): Payer: PPO | Admitting: Internal Medicine

## 2021-06-16 VITALS — BP 121/70 | HR 94 | Temp 97.6°F | Resp 16 | Ht 70.0 in | Wt 169.0 lb

## 2021-06-16 DIAGNOSIS — J439 Emphysema, unspecified: Secondary | ICD-10-CM | POA: Diagnosis not present

## 2021-06-16 DIAGNOSIS — Z136 Encounter for screening for cardiovascular disorders: Secondary | ICD-10-CM | POA: Diagnosis not present

## 2021-06-16 DIAGNOSIS — Z0001 Encounter for general adult medical examination with abnormal findings: Secondary | ICD-10-CM

## 2021-06-16 DIAGNOSIS — Z23 Encounter for immunization: Secondary | ICD-10-CM | POA: Diagnosis not present

## 2021-06-16 DIAGNOSIS — Z1159 Encounter for screening for other viral diseases: Secondary | ICD-10-CM | POA: Diagnosis not present

## 2021-06-16 DIAGNOSIS — Z125 Encounter for screening for malignant neoplasm of prostate: Secondary | ICD-10-CM

## 2021-06-16 DIAGNOSIS — Z0000 Encounter for general adult medical examination without abnormal findings: Secondary | ICD-10-CM

## 2021-06-16 NOTE — Progress Notes (Signed)
Established Patient Office Visit  Subjective:  Patient ID: Jonathan Dean, male    DOB: 10/05/1950  Age: 70 y.o. MRN: [id number]  CC:  Chief Complaint  Patient presents with   Follow-up    6 month follow up dyspnea on exertion he was given budeson he has been much better     HPI ARIYON MITTLEMAN  is a 70 y.o. male with  past medical history of hyperlipidemia, glaucoma and colon polyps who presents for f/u of his chronic medical conditions.  COPD: He is breathing has improved since starting [facility].  He has not required albuterol inhaler since then.  He denies any chest pain or palpitations.  He denies any fever, chills, sore throat, nasal congestion or postnasal drip.  He continues to take Singulair.  He received flu vaccine in the office today.  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      Past Surgical History:  Procedure Laterality Date   COLONOSCOPY N/A 01/20/2021   Procedure: COLONOSCOPY;  Surgeon: Rogene Houston, MD;  [facility];  Service: Endoscopy;  Laterality: N/A;  am   POLYPECTOMY  01/20/2021   Procedure: POLYPECTOMY;  Surgeon: Rogene Houston, MD;  [facility];  Service: Endoscopy;;    History reviewed. No pertinent family history.  Social History   Socioeconomic History   Marital status: Married    Spouse name: Not on file   Number of children: Not on file   Years of education: Not on file   Highest education level: Not on file  Occupational History   Not on file  Tobacco Use   Smoking status: Former    Packs/day: 1.50    Years: 25.00    Pack years: 37.50    Types: Cigarettes    Quit date: 09/14/2005    Years since quitting: 15.7   Smokeless tobacco: Never  Vaping Use   Vaping  Use: Never used  Substance and Sexual Activity   Alcohol use: Yes    Alcohol/week: 2.0 standard drinks    Types: 2 Standard drinks or equivalent per week   Drug use: No   Sexual activity: Yes    Partners: Female    Comment: monogamous  Other Topics Concern   Not on file  Social History Narrative   Not on file   Social Determinants of Health   Financial Resource Strain: Not on file  Food Insecurity: Not on file  Transportation Needs: Not on file  Physical Activity: Not on file  Stress: Not on file  Social Connections: Not on file  Intimate Partner Violence: Not on file    Outpatient Medications Prior to Visit  Medication Sig Dispense Refill   albuterol (VENTOLIN HFA) 108 (90 Base) MCG/ACT inhaler INHALE 2 PUFFS INTO THE LUNGS EVERY 6 HOURS AS NEEDED FOR WHEEZING OR SHORTNESS OF BREATH (Patient taking differently: Inhale 2 puffs into the lungs every 6 (six) hours as needed for wheezing or shortness of breath.) 6.7 g 2   Budeson-Glycopyrrol-Formoterol (BREZTRI AEROSPHERE) 160-9-4.8 MCG/ACT AERO Inhale 2 puffs into the lungs 2 (two) times daily. 10.7 g 11   dorzolamide-timolol (COSOPT) 22.3-6.8 MG/ML ophthalmic solution Place 1 drop into both eyes 2 (two) times daily.  guaiFENesin 200 MG tablet Take 1 tablet (200 mg total) by mouth every 4 (four) hours as needed for cough or to loosen phlegm. 30 tablet 2   latanoprost (XALATAN) 0.005 % ophthalmic solution Place 1 drop into both eyes at bedtime.     montelukast (SINGULAIR) 10 MG tablet TAKE 1 TABLET(10 MG) BY MOUTH AT BEDTIME 90 tablet 3   Multiple Vitamins-Minerals (MULTIVITAMIN WITH MINERALS) tablet Take 1 tablet by mouth as needed.     No facility-administered medications prior to visit.    Allergies  Allergen Reactions   Dorzolamide Hcl-Timolol Mal Other (See Comments)    Swelling of eye lids only the [employer] brand of cosopt drug store is aware of this problem    ROS Review of Systems  Constitutional:  Negative for  chills and fever.  HENT:  Negative for congestion and sore throat.   Eyes:  Negative for pain and discharge.  Respiratory:  Positive for cough and shortness of breath.   Cardiovascular:  Negative for chest pain and palpitations.  Gastrointestinal:  Negative for constipation, diarrhea, nausea and vomiting.  Endocrine: Negative for polydipsia and polyuria.  Genitourinary:  Negative for dysuria and hematuria.  Musculoskeletal:  Negative for neck pain and neck stiffness.  Skin:  Negative for rash.  Neurological:  Negative for dizziness, weakness, numbness and headaches.  Psychiatric/Behavioral:  Negative for agitation and behavioral problems.      Objective:    Physical Exam Vitals reviewed.  Constitutional:      General: He is not in acute distress.    Appearance: He is not diaphoretic.  HENT:     Head: Normocephalic and atraumatic.     Nose: Nose normal.     Mouth/Throat:     Mouth: Mucous membranes are moist.  Eyes:     General: No scleral icterus.    Extraocular Movements: Extraocular movements intact.     Pupils: Pupils are equal, round, and reactive to light.  Cardiovascular:     Rate and Rhythm: Normal rate and regular rhythm.     Pulses: Normal pulses.     Heart sounds: No murmur heard. Pulmonary:     Breath sounds: Normal breath sounds. No wheezing or rales.  Musculoskeletal:     Cervical back: Neck supple. No tenderness.     Right lower leg: No edema.     Left lower leg: No edema.  Skin:    General: Skin is warm.     Findings: No rash.  Neurological:     General: No focal deficit present.     Mental Status: He is alert and oriented to person, place, and time.  Psychiatric:        Mood and Affect: Mood normal.        Behavior: Behavior normal.    BP 121/70 (BP Location: Left Arm, Patient Position: Sitting, Cuff Size: Normal)   Pulse 94   Temp 97.6 F (36.4 C) (Oral)   Resp 16   Ht $R'5\' 10"'iy$  (1.778 m)   Wt 169 lb (76.7 kg)   SpO2 95%   BMI 24.25 kg/m  Wt  Readings from Last 3 Encounters:  06/16/21 169 lb (76.7 kg)  06/08/21 168 lb (76.2 kg)  05/23/21 160 lb (72.6 kg)     Health Maintenance Due  Topic Date Due   Hepatitis C Screening  Never done   COVID-19 Vaccine (4 - Booster for Pfizer series) 01/26/2021    There are no preventive care reminders to display for this  patient.  Lab Results  Component Value Date   TSH 2.720 11/22/2020   Lab Results  Component Value Date   WBC 5.8 11/22/2020   HGB 14.9 11/22/2020   HCT 43.6 11/22/2020   MCV 93 11/22/2020   PLT 255 11/22/2020   Lab Results  Component Value Date   NA 136 12/13/2020   K 4.1 12/13/2020   CO2 27 12/13/2020   GLUCOSE 141 (H) 12/13/2020   BUN 13 12/13/2020   CREATININE 1.09 12/13/2020   BILITOT 0.4 11/22/2020   ALKPHOS 68 11/22/2020   AST 26 11/22/2020   ALT 40 11/22/2020   PROT 7.0 11/22/2020   ALBUMIN 4.2 11/22/2020   CALCIUM 9.9 12/13/2020   ANIONGAP 11 12/13/2020   EGFR 73 11/22/2020   Lab Results  Component Value Date   CHOL 249 (H) 11/22/2020   Lab Results  Component Value Date   HDL 61 11/22/2020   Lab Results  Component Value Date   LDLCALC 162 (H) 11/22/2020   Lab Results  Component Value Date   TRIG 146 11/22/2020   Lab Results  Component Value Date   CHOLHDL 4.1 11/22/2020   Lab Results  Component Value Date   HGBA1C 5.8 (H) 11/22/2020      Assessment & Plan:   Problem List Items Addressed This Visit       Respiratory   COPD (chronic obstructive pulmonary disease) ([place]) - Primary    Recent chest x-ray revealed -shows bronchitic and emphysema changes Better with Breztri now Albuterol as needed Continue Singulair F/u with pulmonology for PFTs      Other Visit Diagnoses     Need for hepatitis C screening test       Relevant Orders   Hepatitis C Antibody   Screening for prostate cancer       Relevant Orders   PSA   Encounter for abdominal aortic aneurysm (AAA) screening       Relevant Orders   [place] AORT[place]  MEDICARE SCREENING   Need for immunization against influenza       Relevant Orders   Flu Vaccine QUAD High Dose(Fluad) (Completed)       No orders of the defined types were placed in this encounter.   Follow-up: Return in about 6 months (around 12/14/2021) for Annual physical.    Lindell Spar, MD

## 2021-06-16 NOTE — Assessment & Plan Note (Signed)
Recent chest x-ray revealed -shows bronchitic and emphysema changes Better with Jonathan Dean now Albuterol as needed Continue Singulair F/u with pulmonology for PFTs

## 2021-06-16 NOTE — Patient Instructions (Signed)
Continue to use Breztri as prescribed.  Please continue to follow low cholesterol diet and perform moderate exercise/walking at least 150 mins/week.  Please get fasting blood tests done before the next visit.  You are being scheduled to get US abdomen for AAA screening.

## 2021-06-18 ENCOUNTER — Encounter: Payer: Self-pay | Admitting: Internal Medicine

## 2021-06-28 ENCOUNTER — Ambulatory Visit (HOSPITAL_COMMUNITY): Payer: PPO

## 2021-07-05 ENCOUNTER — Other Ambulatory Visit: Payer: Self-pay

## 2021-07-05 ENCOUNTER — Ambulatory Visit (HOSPITAL_COMMUNITY)
Admission: RE | Admit: 2021-07-05 | Discharge: 2021-07-05 | Disposition: A | Discharge status: Home / Self Care | Payer: PPO | Source: Ambulatory Visit | Attending: Internal Medicine | Admitting: Internal Medicine

## 2021-07-05 DIAGNOSIS — Z136 Encounter for screening for cardiovascular disorders: Secondary | ICD-10-CM | POA: Insufficient documentation

## 2021-07-05 DIAGNOSIS — Z87891 Personal history of nicotine dependence: Secondary | ICD-10-CM | POA: Insufficient documentation

## 2021-07-22 ENCOUNTER — Ambulatory Visit: Payer: PPO | Admitting: Pulmonary Disease

## 2021-07-22 ENCOUNTER — Other Ambulatory Visit: Payer: Self-pay

## 2021-07-22 ENCOUNTER — Encounter: Payer: Self-pay | Admitting: Pulmonary Disease

## 2021-07-22 VITALS — BP 130/74 | HR 64 | Temp 98.2°F | Ht 70.0 in | Wt 173.1 lb

## 2021-07-22 DIAGNOSIS — J449 Chronic obstructive pulmonary disease, unspecified: Secondary | ICD-10-CM

## 2021-07-22 NOTE — Progress Notes (Signed)
[facility]  Chief Complaint  Patient presents with   Follow-up    Previously seen Dr. Melvyn Novas.  SOB with exertion. Inhaler has helped since hes been on it. Cough with clear mucus.     Past Surgical History:  He  has a past surgical history that includes Colonoscopy (N/A, 01/20/2021) and polypectomy (01/20/2021).  Past Medical History:  ED, Glaucoma, HLD, Colon polyp  Constitutional:  BP 130/74 (BP Location: Left Arm, Patient Position: Sitting)   Pulse 64   Temp 98.2 F (36.8 C) (Temporal)   Ht 5\' 10"  (1.778 m)   Wt 173 lb 1.9 oz (78.5 kg)   SpO2 98%   BMI 24.84 kg/m   Brief Summary:  Jonathan Dean is a 70 y.o. male former smoker with obstructive lung disease.      Subjective:   He was seen previously by Dr. Melvyn Novas.  He is from [place].  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  He reports an episode in the 1990's when he had work accident exposure to sulfuric acid spill.  Within 24 hours after this exposure he developed burning in his sinuses, back of his throat, and chest.  He also reports having exposure to asbestos and welding fumes from various work sites.  He smoked cigarettes, but quit years ago.  No family history of lung disease.  He retired from being an Clinical biochemist.  At the time he didn't have any symptoms of cough or dyspnea.  In November 2021 he received Prevnar 13 vaccine.  About a week after this he woke up feeling short of breath.  He has noticed trouble ever since then.  He does okay with light activity, but gets short of breath with more strenuous activity.  He was having a cough.  He was started on breztri and singulair.  These have helped with his cough and breathing, but he still gets winded with strenuous activity.  Reports his SpO2 on room air at home usually stays in the 90's, but can sometimes dip to 89%.  He doesn't feel like he has any  trouble with his breathing while asleep.  No history of pneumonia or exposure to tuberculosis.  Denies history of heart disease or thromboembolic disease.  CXR from 05/25/21 showed changes of COPD and apical scarring.  Physical Exam:   Appearance - well kempt   ENMT - no sinus tenderness, no oral exudate, no LAN, Mallampati 2 airway, no stridor, wears dentures, deviated nasal septum  Respiratory - equal breath sounds bilaterally, no wheezing or rales  CV - s1s2 regular rate and rhythm, no murmurs  Ext - no clubbing, no edema  Skin - changes of vitiligo on hands and lower legs  Psych - normal mood and affect   Pulmonary testing:  A1AT 12/13/20 >> 118, MM  Chest Imaging:    Sleep Tests:    Cardiac Tests:    Social History:  He  reports that he quit smoking about 15 years ago. His smoking use included cigarettes. He has a 37.50 pack-year smoking history. He has never used smokeless tobacco. He reports current alcohol use of about 2.0 standard drinks per week. He reports that he does not use drugs.  Family History:  His family history is not on file.    Discussion:  He has dyspnea on exertion and cough.  Reports symptoms starting after Prevnar 13 vaccination in November  2021.  He has prior history of tobacco smoking.  He worked as an Clinical biochemist and reports possible exposure to asbestos and welding fumes.  He also reports work place accident exposure to sulfuric acid in 1990's likely resulting in chemical burn to upper and lower airways.  He reports symptomatic improvement with inhaler therapy and singulair.  Assessment/Plan:   Obstructive lung disease. - continue breztri, singulair, and prn albuterol - will arrange for pulmonary function test and overnight oximetry - if he has significant abnormalities on PFT, then might need CT chest imaging  Time Spent Involved in Patient Care on Day of Examination:  32 minutes  Follow up:   Patient Instructions  Will arrange for  pulmonary function test in [place] office and overnight oxygen test at home  Follow up in 6 to 8 weeks with Dr. Halford Chessman or Nurse Practitioner after your breathing test is completed  Medication List:   Allergies as of 07/22/2021       Reactions   Dorzolamide Hcl-timolol Mal Other (See Comments)   Swelling of eye lids only the B & L brand of cosopt drug store is aware of this problem        Medication List        Accurate as of July 22, 2021 10:18 AM. If you have any questions, ask your nurse or doctor.          albuterol 108 (90 Base) MCG/ACT inhaler Commonly known as: VENTOLIN HFA INHALE 2 PUFFS INTO THE LUNGS EVERY 6 HOURS AS NEEDED FOR WHEEZING OR SHORTNESS OF BREATH   Breztri Aerosphere 160-9-4.8 MCG/ACT Aero Generic drug: Budeson-Glycopyrrol-Formoterol Inhale 2 puffs into the lungs 2 (two) times daily.   dorzolamide-timolol 22.3-6.8 MG/ML ophthalmic solution Commonly known as: COSOPT Place 1 drop into both eyes 2 (two) times daily.   guaiFENesin 200 MG tablet Take 1 tablet (200 mg total) by mouth every 4 (four) hours as needed for cough or to loosen phlegm.   latanoprost 0.005 % ophthalmic solution Commonly known as: XALATAN Place 1 drop into both eyes at bedtime.   montelukast 10 MG tablet Commonly known as: SINGULAIR TAKE 1 TABLET(10 MG) BY MOUTH AT BEDTIME   multivitamin with minerals tablet Take 1 tablet by mouth as needed.        Signature:  Chesley Mires, MD [place] Pager - [phone] 07/22/2021, 10:18 AM

## 2021-07-22 NOTE — Patient Instructions (Signed)
Will arrange for pulmonary function test in [place] office and overnight oxygen test at home  Follow up in 6 to 8 weeks with Dr. Halford Chessman or Nurse Practitioner after your breathing test is completed

## 2021-07-25 DIAGNOSIS — H401121 Primary open-angle glaucoma, left eye, mild stage: Secondary | ICD-10-CM | POA: Diagnosis not present

## 2021-07-25 DIAGNOSIS — H401113 Primary open-angle glaucoma, right eye, severe stage: Secondary | ICD-10-CM | POA: Diagnosis not present

## 2021-07-26 DIAGNOSIS — J449 Chronic obstructive pulmonary disease, unspecified: Secondary | ICD-10-CM | POA: Diagnosis not present

## 2021-07-29 ENCOUNTER — Telehealth: Payer: Self-pay | Admitting: Pulmonary Disease

## 2021-07-29 NOTE — Telephone Encounter (Signed)
ONO with RA 07/26/21 >> test time 3 hrs 33 min.  Mean SpO2 87%, low SpO2 78%.  Spent 2 hr 33 min with SpO2 < 88%.   Please let him know his oxygen level is low at night.  Please send order to have him start using 2 liters oxygen with sleep.

## 2021-08-03 ENCOUNTER — Other Ambulatory Visit (HOSPITAL_BASED_OUTPATIENT_CLINIC_OR_DEPARTMENT_OTHER): Payer: Self-pay

## 2021-08-03 ENCOUNTER — Other Ambulatory Visit: Payer: Self-pay

## 2021-08-03 ENCOUNTER — Ambulatory Visit: Payer: PPO | Attending: Internal Medicine

## 2021-08-03 DIAGNOSIS — Z23 Encounter for immunization: Secondary | ICD-10-CM

## 2021-08-03 MED ORDER — PFIZER COVID-19 VAC BIVALENT 30 MCG/0.3ML IM SUSP
INTRAMUSCULAR | 0 refills | Status: DC
  Filled 2021-08-03: qty 0.3, 1d supply, fill #0

## 2021-08-03 NOTE — Progress Notes (Signed)
   Covid-19 Vaccination Clinic  Name:  Jonathan Dean    MRN: [id number] DOB: [birth year]  08/03/2021  Mr. Jonathan Dean was observed post Covid-19 immunization for 15 minutes without incident. He was provided with Vaccine Information Sheet and instruction to access the V-Safe system.   Mr. Jonathan Dean was instructed to call 911 with any severe reactions post vaccine: Difficulty breathing  Swelling of face and throat  A fast heartbeat  A bad rash all over body  Dizziness and weakness   Immunizations Administered     Name Date Dose VIS Date Route   Pfizer Covid-19 Vaccine Bivalent Booster 08/03/2021  2:13 PM 0.3 mL 05/25/2021 Intramuscular   Manufacturer: [place]   Lot: [id number]   [place]: [phone]

## 2021-08-03 NOTE — Telephone Encounter (Signed)
ATC. LMTCB with [facility] office number

## 2021-08-04 ENCOUNTER — Telehealth: Payer: Self-pay | Admitting: Pulmonary Disease

## 2021-08-04 DIAGNOSIS — R0902 Hypoxemia: Secondary | ICD-10-CM

## 2021-08-04 NOTE — Telephone Encounter (Signed)
ONO with RA 07/26/21 >> test time 3 hrs 33 min.  Mean SpO2 87%, low SpO2 78%.  Spent 2 hr 33 min with SpO2 < 88%.     Please let him know his oxygen level is low at night.  Please send order to have him start using 2 liters oxygen with sleep.   I have called the pt nd he is aware of these results.  Order has been placed and pt is aware.

## 2021-08-05 ENCOUNTER — Other Ambulatory Visit: Payer: Self-pay

## 2021-08-05 DIAGNOSIS — J449 Chronic obstructive pulmonary disease, unspecified: Secondary | ICD-10-CM

## 2021-08-05 NOTE — Progress Notes (Signed)
Ef dme

## 2021-08-05 NOTE — Telephone Encounter (Signed)
Called and spoke to patient. He voiced understanding. Oxygen order created

## 2021-08-17 ENCOUNTER — Encounter: Payer: Self-pay | Admitting: Pulmonary Disease

## 2021-08-17 DIAGNOSIS — J449 Chronic obstructive pulmonary disease, unspecified: Secondary | ICD-10-CM | POA: Diagnosis not present

## 2021-08-17 NOTE — Telephone Encounter (Signed)
I have called [place] and LM for Jonathan Dean to call us back with an update on oxygen order.

## 2021-09-05 ENCOUNTER — Ambulatory Visit: Payer: PPO | Admitting: Adult Health

## 2021-09-05 ENCOUNTER — Ambulatory Visit (INDEPENDENT_AMBULATORY_CARE_PROVIDER_SITE_OTHER): Payer: PPO | Admitting: Pulmonary Disease

## 2021-09-05 ENCOUNTER — Encounter: Payer: Self-pay | Admitting: Adult Health

## 2021-09-05 ENCOUNTER — Other Ambulatory Visit: Payer: Self-pay

## 2021-09-05 DIAGNOSIS — J439 Emphysema, unspecified: Secondary | ICD-10-CM

## 2021-09-05 DIAGNOSIS — J9611 Chronic respiratory failure with hypoxia: Secondary | ICD-10-CM | POA: Diagnosis not present

## 2021-09-05 DIAGNOSIS — J449 Chronic obstructive pulmonary disease, unspecified: Secondary | ICD-10-CM

## 2021-09-05 LAB — PULMONARY FUNCTION TEST
DL/VA % pred: 91 %
DL/VA: 3.72 ml/min/mmHg/L
DLCO cor % pred: 84 %
DLCO cor: 20.98 ml/min/mmHg
DLCO unc % pred: 84 %
DLCO unc: 20.98 ml/min/mmHg
FEF 25-75 Post: 0.82 L/sec
FEF 25-75 Pre: 0.64 L/sec
FEF2575-%Change-Post: 27 %
FEF2575-%Pred-Post: 35 %
FEF2575-%Pred-Pre: 27 %
FEV1-%Change-Post: 10 %
FEV1-%Pred-Post: 51 %
FEV1-%Pred-Pre: 46 %
FEV1-Post: 1.58 L
FEV1-Pre: 1.43 L
FEV1FVC-%Change-Post: 2 %
FEV1FVC-%Pred-Pre: 72 %
FEV6-%Change-Post: 9 %
FEV6-%Pred-Post: 72 %
FEV6-%Pred-Pre: 66 %
FEV6-Post: 2.85 L
FEV6-Pre: 2.61 L
FEV6FVC-%Change-Post: 0 %
FEV6FVC-%Pred-Post: 104 %
FEV6FVC-%Pred-Pre: 103 %
FVC-%Change-Post: 7 %
FVC-%Pred-Post: 69 %
FVC-%Pred-Pre: 64 %
FVC-Post: 2.9 L
FVC-Pre: 2.69 L
Post FEV1/FVC ratio: 54 %
Post FEV6/FVC ratio: 98 %
Pre FEV1/FVC ratio: 53 %
Pre FEV6/FVC Ratio: 98 %
RV % pred: 170 %
RV: 4.04 L
TLC % pred: 105 %
TLC: 7.1 L

## 2021-09-05 NOTE — Progress Notes (Signed)
Reviewed and agree with assessment/plan.   Chesley Mires, MD [facility] 09/05/2021, 4:43 PM Pager:  [phone]

## 2021-09-05 NOTE — Progress Notes (Signed)
@Patient  ID: Jonathan Dean, male    DOB: November 15, 1950, 70 y.o.   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  TEST/EVENTS :  ONO with RA 07/26/21 >> test time 3 hrs 33 min.  Mean SpO2 87%, low SpO2 78%.  Spent 2 hr 33 min with SpO2 < 88%.  A1AT 12/13/20 >> 118, MM  09/05/2021 Follow-up COPD Patient presents for a 1 month follow-up.  Patient has a heavy smoking history.  He was set up for pulmonary function testing that shows severe COPD with an FEV1 at 51%, ratio 54, FVC 69%, 10% bronchodilator change, DLCO 84%.  Patient remains on Breztri twice daily.  Patient says he feels like this is working pretty well.  Patient remains on oxygen at bedtime.  Patient says he checks his oxygen levels periodically at home.  Typically stays well above 89 to 90%.  Even with exercise.  Patient says he is very active.  Goes to the gym.  Is able to do yard work and hobbies. Has rare albuterol inhaler use.  Chest x-ray last visit showed a COPD changes with biapical scarring. COVID booster and influenza are up-to-date      Allergies  Allergen Reactions   Dorzolamide Hcl-Timolol Mal Other (See Comments)    Swelling of eye lids only the B & L brand of cosopt drug store is aware of this problem    Immunization History  Administered Date(s) Administered   Fluad Quad(high Dose 65+) 06/25/2020, 06/16/2021   Influenza Split 10/04/2012   Influenza Whole 06/30/2009   Influenza,inj,Quad PF,6+ Mos 08/01/2014   PFIZER(Purple Top)SARS-COV-2 Vaccination 12/18/2019, 01/10/2020, 11/03/2020   Pfizer Covid-19 Vaccine Bivalent Booster 28yrs & up 08/03/2021   Pneumococcal Conjugate-13 07/26/2020   Td 09/25/1997, 06/29/2008    Past  Medical History:  Diagnosis Date   ERECTILE DYSFUNCTION 06/30/2009   Qualifier: Diagnosis of  By: Andria Frames MD, Gwyndolyn Saxon     Glaucoma    Hyperlipidemia    Polyp of colon, adenomatous 09/15/2011   Colonoscopy 11/07/11 removed at least two adenomatous polyps confirmed by surgical path.      Tobacco History: Social History   Tobacco Use  Smoking Status Former   Packs/day: 1.50   Years: 25.00   Pack years: 37.50   Types: Cigarettes   Quit date: 09/14/2005   Years since quitting: 15.9  Smokeless Tobacco Never   Counseling given: Not Answered   Outpatient Medications Prior to Visit  Medication Sig Dispense Refill   albuterol (VENTOLIN HFA) 108 (90 Base) MCG/ACT inhaler INHALE 2 PUFFS INTO THE LUNGS EVERY 6 HOURS AS NEEDED FOR WHEEZING OR SHORTNESS OF BREATH (Patient taking differently: Inhale 2 puffs into the lungs every 6 (six) hours as needed for wheezing or shortness of breath.) 6.7 g 2   Budeson-Glycopyrrol-Formoterol (BREZTRI AEROSPHERE) 160-9-4.8 MCG/ACT AERO Inhale 2 puffs into the lungs 2 (two) times daily. 10.7 g 11   COVID-19 mRNA bivalent vaccine, Pfizer, (PFIZER COVID-19 VAC BIVALENT) injection Inject into the muscle. 0.3 mL 0   dorzolamide-timolol (COSOPT) 22.3-6.8 MG/ML ophthalmic solution Place 1 drop into both eyes 2 (two) times daily.     guaiFENesin 200 MG tablet Take 1 tablet (200 mg total) by mouth every 4 (four) hours as needed  for cough or to loosen phlegm. 30 tablet 2   latanoprost (XALATAN) 0.005 % ophthalmic solution Place 1 drop into both eyes at bedtime.     montelukast (SINGULAIR) 10 MG tablet TAKE 1 TABLET(10 MG) BY MOUTH AT BEDTIME 90 tablet 3   Multiple Vitamins-Minerals (MULTIVITAMIN WITH MINERALS) tablet Take 1 tablet by mouth as needed.     No facility-administered medications prior to visit.     Review of Systems:   Constitutional:   No  weight loss, night sweats,  Fevers, chills,  +fatigue, or  lassitude.  HEENT:   No headaches,  Difficulty  swallowing,  Tooth/dental problems, or  Sore throat,                No sneezing, itching, ear ache, nasal congestion, post nasal drip,   CV:  No chest pain,  Orthopnea, PND, swelling in lower extremities, anasarca, dizziness, palpitations, syncope.   GI  No heartburn, indigestion, abdominal pain, nausea, vomiting, diarrhea, change in bowel habits, loss of appetite, bloody stools.   Resp: .  No chest wall deformity  Skin: no rash or lesions.  GU: no dysuria, change in color of urine, no urgency or frequency.  No flank pain, no hematuria   MS:  No joint pain or swelling.  No decreased range of motion.  No back pain.    Physical Exam  BP 134/84 (BP Location: Left Arm, Patient Position: Sitting, Cuff Size: Normal)   Pulse 83   Temp 98.4 F (36.9 C) (Oral)   Ht 5\' 8"  (1.727 m)   Wt 174 lb 9.6 oz (79.2 kg)   SpO2 94%   BMI 26.55 kg/m   GEN: A/Ox3; pleasant , NAD, well nourished    HEENT:  [place]/AT,  NOSE-clear, THROAT-clear, no lesions, no postnasal drip or exudate noted.   NECK:  Supple w/ fair ROM; no JVD; normal carotid impulses w/o bruits; no thyromegaly or nodules palpated; no lymphadenopathy.    RESP  Clear  P & A; w/o, wheezes/ rales/ or rhonchi. no accessory muscle use, no dullness to percussion  CARD:  RRR, no m/r/g, no peripheral edema, pulses intact, no cyanosis or clubbing.  GI:   Soft & nt; nml bowel sounds; no organomegaly or masses detected.   Musco: Warm bil, no deformities or joint swelling noted.   Neuro: alert, no focal deficits noted.    Skin: Warm, no lesions or rashes    Lab Results:      ProBNP No results found for: PROBNP  Imaging: No results found.    PFT Results Latest Ref Rng & Units 09/05/2021  FVC-Pre L 2.69  FVC-Predicted Pre % 64  FVC-Post L 2.90  FVC-Predicted Post % 69  Pre FEV1/FVC % % 53  Post FEV1/FCV % % 54  FEV1-Pre L 1.43  FEV1-Predicted Pre % 46  FEV1-Post L 1.58  DLCO uncorrected ml/min/mmHg 20.98  DLCO UNC% %  84  DLCO corrected ml/min/mmHg 20.98  DLCO COR %Predicted % 84  DLVA Predicted % 91  TLC L 7.10  TLC % Predicted % 105  RV % Predicted % 170    No results found for: NITRICOXIDE      Assessment & Plan:   COPD (chronic obstructive pulmonary disease) (HCC) Moderate to severe COPD currently well compensated on Breztri inhaler. Patient is very active with minimum symptom burden. Influenza vaccine is up-to-date along with COVID booster.  Plan  Patient Instructions  Continue on BREZTRI 2 puffs Twice daily , rinse after use.  Albuterol inhaler 1-2 puffs every 6hrs as needed  Continue on Oxygen 2l/m At bedtime   Activity as tolerated.  Follow up with Dr. Halford Chessman  in 4 months and As needed  -[place]          Chronic respiratory failure with hypoxia ([place]) Continue on O2 2l/m At bedtime       Rexene Edison, NP 09/05/2021

## 2021-09-05 NOTE — Patient Instructions (Addendum)
Continue on BREZTRI 2 puffs Twice daily , rinse after use.  Albuterol inhaler 1-2 puffs every 6hrs as needed  Continue on Oxygen 2l/m At bedtime   Activity as tolerated.  Follow up with Dr. Halford Chessman  in 4 months and As needed  -Jonathan Dean

## 2021-09-05 NOTE — Assessment & Plan Note (Signed)
Moderate to severe COPD currently well compensated on Breztri inhaler. Patient is very active with minimum symptom burden. Influenza vaccine is up-to-date along with COVID booster.  Plan  Patient Instructions  Continue on BREZTRI 2 puffs Twice daily , rinse after use.  Albuterol inhaler 1-2 puffs every 6hrs as needed  Continue on Oxygen 2l/m At bedtime   Activity as tolerated.  Follow up with Dr. Halford Chessman  in 4 months and As needed  -[place]

## 2021-09-05 NOTE — Progress Notes (Signed)
PFT done today. 

## 2021-09-05 NOTE — Assessment & Plan Note (Signed)
Continue on O2 2l/m At bedtime

## 2021-09-16 DIAGNOSIS — J449 Chronic obstructive pulmonary disease, unspecified: Secondary | ICD-10-CM | POA: Diagnosis not present

## 2021-10-06 ENCOUNTER — Encounter: Payer: Self-pay | Admitting: Internal Medicine

## 2021-10-06 ENCOUNTER — Other Ambulatory Visit: Payer: Self-pay | Admitting: Internal Medicine

## 2021-10-06 DIAGNOSIS — R0609 Other forms of dyspnea: Secondary | ICD-10-CM

## 2021-10-06 MED ORDER — MONTELUKAST SODIUM 10 MG PO TABS: ORAL_TABLET | ORAL | 3 refills | Status: DC

## 2021-10-17 DIAGNOSIS — J449 Chronic obstructive pulmonary disease, unspecified: Secondary | ICD-10-CM | POA: Diagnosis not present

## 2021-11-07 ENCOUNTER — Telehealth: Payer: Self-pay

## 2021-11-07 NOTE — Telephone Encounter (Signed)
AZ&ME  form  Copied Sle[place] Noted

## 2021-11-10 DIAGNOSIS — Z0279 Encounter for issue of other medical certificate: Secondary | ICD-10-CM

## 2021-11-10 NOTE — Telephone Encounter (Signed)
Completed, faxed, pt notified

## 2021-11-14 ENCOUNTER — Ambulatory Visit (INDEPENDENT_AMBULATORY_CARE_PROVIDER_SITE_OTHER): Payer: PPO

## 2021-11-14 ENCOUNTER — Other Ambulatory Visit: Payer: Self-pay

## 2021-11-14 DIAGNOSIS — Z0000 Encounter for general adult medical examination without abnormal findings: Secondary | ICD-10-CM | POA: Diagnosis not present

## 2021-11-14 DIAGNOSIS — Z1159 Encounter for screening for other viral diseases: Secondary | ICD-10-CM

## 2021-11-14 NOTE — Progress Notes (Signed)
I connected with  Jonathan Dean on 11/14/21 by a audio enabled telemedicine application and verified that I am speaking with the correct person using two identifiers.  Patient Location: Home  Provider Location: Home Office  I discussed the limitations of evaluation and management by telemedicine. The patient expressed understanding and agreed to proceed.  Subjective:   Jonathan Dean is a 71 y.o. male who presents for Medicare Annual/Subsequent preventive examination.  Review of Systems     Cardiac Risk Factors include: dyslipidemia;male gender;advanced age (>34men, >22 women)     Objective:    There were no vitals filed for this visit. There is no height or weight on file to calculate BMI.  Advanced Directives 11/14/2021 01/20/2021  Does Patient Have a Medical Advance Directive? No No  Would patient like information on creating a medical advance directive? Yes (ED - Information included in AVS) No - Patient declined    Current Medications (verified) Outpatient Encounter Medications as of 11/14/2021  Medication Sig   albuterol (VENTOLIN HFA) 108 (90 Base) MCG/ACT inhaler INHALE 2 PUFFS INTO THE LUNGS EVERY 6 HOURS AS NEEDED FOR WHEEZING OR SHORTNESS OF BREATH (Patient taking differently: Inhale 2 puffs into the lungs every 6 (six) hours as needed for wheezing or shortness of breath.)   Budeson-Glycopyrrol-Formoterol (BREZTRI AEROSPHERE) 160-9-4.8 MCG/ACT AERO Inhale 2 puffs into the lungs 2 (two) times daily.   COVID-19 mRNA bivalent vaccine, Pfizer, (PFIZER COVID-19 VAC BIVALENT) injection Inject into the muscle.   dorzolamide-timolol (COSOPT) 22.3-6.8 MG/ML ophthalmic solution Place 1 drop into both eyes 2 (two) times daily.   guaiFENesin 200 MG tablet Take 1 tablet (200 mg total) by mouth every 4 (four) hours as needed for cough or to loosen phlegm.   latanoprost (XALATAN) 0.005 % ophthalmic solution Place 1 drop into both eyes at bedtime.   montelukast (SINGULAIR) 10 MG  tablet TAKE 1 TABLET(10 MG) BY MOUTH AT BEDTIME   Multiple Vitamins-Minerals (MULTIVITAMIN WITH MINERALS) tablet Take 1 tablet by mouth as needed.   No facility-administered encounter medications on file as of 11/14/2021.    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     Past Surgical History:  Procedure Laterality Date   COLONOSCOPY N/A 01/20/2021   Procedure: COLONOSCOPY;  Surgeon: Rogene Houston, MD;  [facility];  Service: Endoscopy;  Laterality: N/A;  am   POLYPECTOMY  01/20/2021   Procedure: POLYPECTOMY;  Surgeon: Rogene Houston, MD;  Location: [facility];  Service: Endoscopy;;   No family history on file. Social History   Socioeconomic History   Marital status: Married    Spouse name: Not on file   Number of children: Not on file   Years of education: Not on file   Highest education level: Not on file  Occupational History   Not on file  Tobacco Use   Smoking status: Former    Packs/day: 1.50    Years: 25.00    Pack years: 37.50    Types: Cigarettes    Quit date: 09/14/2005    Years since quitting: 16.1   Smokeless tobacco: Never  Vaping Use   Vaping Use: Never used  Substance and Sexual Activity  Alcohol use: Yes    Alcohol/week: 2.0 standard drinks    Types: 2 Standard drinks or equivalent per week   Drug use: No   Sexual activity: Yes    Partners: Female    Comment: monogamous  Other Topics Concern   Not on file  Social History Narrative   Not on file   Social Determinants of Health   Financial Resource Strain: Not on file  Food Insecurity: Not on file  Transportation Needs: No Transportation Needs   Lack of Transportation  (Medical): No   Lack of Transportation (Non-Medical): No  Physical Activity: Sufficiently Active   Days of Exercise per Week: 7 days   Minutes of Exercise per Session: 30 min  Stress: Not on file  Social Connections: Not on file    Tobacco Counseling Counseling given: Not Answered   Clinical Intake:  Pre-visit preparation completed: No  Pain : No/denies pain     Nutritional Status: BMI 25 -29 Overweight Diabetes: No  How often do you need to have someone help you when you read instructions, pamphlets, or other written materials from your doctor or pharmacy?: 1 - Never What is the last grade level you completed in school?: 12  Diabetic?na         Activities of Daily Living In your present state of health, do you have any difficulty performing the following activities: 11/14/2021  Hearing? N  Vision? N  Difficulty concentrating or making decisions? N  Walking or climbing stairs? N  Dressing or bathing? N  Doing errands, shopping? N  Preparing Food and eating ? N  Using the Toilet? N  In the past six months, have you accidently leaked urine? N  Do you have problems with loss of bowel control? N  Managing your Medications? N  Managing your Finances? N  Housekeeping or managing your Housekeeping? N  Some recent data might be hidden    Patient Care Team: Lindell Spar, MD as PCP - General (Internal Medicine)  Indicate any recent Medical Services you may have received from other than [facility] providers in the past year (date may be approximate).     Assessment:   This is a routine wellness examination for Jonathan Dean.  Hearing/Vision screen No results found.  Dietary issues and exercise activities discussed: Current Exercise Habits: Home exercise routine, Type of exercise: strength training/weights, Time (Minutes): 25, Frequency (Times/Week): 2, Weekly Exercise (Minutes/Week): 50, Intensity: Moderate   Goals Addressed   None   Depression Screen PHQ 2/9 Scores  06/16/2021 06/08/2021 05/23/2021 02/23/2021 12/15/2020 10/20/2020 10/04/2020  PHQ - 2 Score 0 1 0 0 0 0 0    Fall Risk Fall Risk  11/14/2021 06/16/2021 06/08/2021 05/23/2021 02/23/2021  Falls in the past year? 0 0 0 0 0  Number falls in past yr: 0 0 0 0 0  Injury with Fall? 0 0 0 0 0  Risk for fall due to : - No Fall Risks No Fall Risks No Fall Risks No Fall Risks  Follow up - Falls evaluation completed Falls evaluation completed Falls evaluation completed Falls evaluation completed    [place]:  Any stairs in or around the home? No  If so, are there any without handrails? No  Home free of loose throw rugs in walkways, pet beds, electrical cords, etc? Yes  Adequate lighting in your home to reduce risk of falls? Yes   ASSISTIVE DEVICES UTILIZED TO PREVENT FALLS:  Life alert? No  Use of  a cane, walker or w/c? No  Grab bars in the bathroom? Yes  Shower chair or bench in shower? Yes  Elevated toilet seat or a handicapped toilet? No   TIMED UP AND GO:  Was the test performed? No .     Cognitive Function:     6CIT Screen 11/14/2021  What Year? 0 points  What month? 0 points  What time? 0 points  Count back from 20 0 points  Months in reverse 0 points  Repeat phrase 0 points  Total Score 0    Immunizations Immunization History  Administered Date(s) Administered   Fluad Quad(high Dose 65+) 06/25/2020, 06/16/2021   Influenza Split 10/04/2012   Influenza Whole 06/30/2009   Influenza,inj,Quad PF,6+ Mos 08/01/2014   PFIZER(Purple Top)SARS-COV-2 Vaccination 12/18/2019, 01/10/2020, 11/03/2020   Pfizer Covid-19 Vaccine Bivalent Booster 30yrs & up 08/03/2021   Pneumococcal Conjugate-13 07/26/2020   Td 09/25/1997, 06/29/2008    TDAP status: Due, Education has been provided regarding the importance of this vaccine. Advised may receive this vaccine at local pharmacy or Health Dept. Aware to provide a copy of the vaccination record if obtained from local  pharmacy or Health Dept. Verbalized acceptance and understanding.  Flu Vaccine status: Up to date  Pneumococcal vaccine status: Declined,  Education has been provided regarding the importance of this vaccine but patient still declined. Advised may receive this vaccine at local pharmacy or Health Dept. Aware to provide a copy of the vaccination record if obtained from local pharmacy or Health Dept. Verbalized acceptance and understanding.   Covid-19 vaccine status: Completed vaccines  Qualifies for Shingles Vaccine? Yes   Zostavax completed No   Shingrix Completed?: No.    Education has been provided regarding the importance of this vaccine. Patient has been advised to call insurance company to determine out of pocket expense if they have not yet received this vaccine. Advised may also receive vaccine at local pharmacy or Health Dept. Verbalized acceptance and understanding.  Screening Tests Health Maintenance  Topic Date Due   Hepatitis C Screening  Never done   Zoster Vaccines- Shingrix (1 of 2) Never done   TETANUS/TDAP  02/23/2022 (Originally 06/29/2018)   COLONOSCOPY (Pts 45-87yrs Insurance coverage will need to be confirmed)  01/20/2026   INFLUENZA VACCINE  Completed   COVID-19 Vaccine  Completed   HPV VACCINES  Aged Out   Pneumonia Vaccine 72+ Years old  Discontinued    Health Maintenance  Health Maintenance Due  Topic Date Due   Hepatitis C Screening  Never done   Zoster Vaccines- Shingrix (1 of 2) Never done    Colorectal cancer screening: Type of screening: Colonoscopy. Completed yes. Repeat every 5 years  Lung Cancer Screening: (Low Dose CT Chest recommended if Age 99-80 years, 30 pack-year currently smoking OR have quit w/in 15years.) does not qualify.   Lung Cancer Screening Referral: na  Additional Screening:  Hepatitis C Screening: does qualify; Completed ordered  Vision Screening: Recommended annual ophthalmology exams for early detection of glaucoma and other  disorders of the eye. Is the patient up to date with their annual eye exam?  Yes  Who is the provider or what is the name of the office in which the patient attends annual eye exams?  If pt is not established with a provider, would they like to be referred to a provider to establish care? No .   Dental Screening: Recommended annual dental exams for proper oral hygiene  Community Resource Referral / Chronic Care Management: CRR  required this visit?  No   CCM required this visit?  No      Plan:     I have personally reviewed and noted the following in the patients chart:   Medical and social history Use of alcohol, tobacco or illicit drugs  Current medications and supplements including opioid prescriptions. Patient is not currently taking opioid prescriptions. Functional ability and status Nutritional status Physical activity Advanced directives List of other physicians Hospitalizations, surgeries, and ER visits in previous 12 months Vitals Screenings to include cognitive, depression, and falls Referrals and appointments  In addition, I have reviewed and discussed with patient certain preventive protocols, quality metrics, and best practice recommendations. A written personalized care plan for preventive services as well as general preventive health recommendations were provided to patient.     Kate Sable, LPN, LPN   01/28/3975   Nurse Notes:  Jonathan Dean , Thank you for taking time to come for your Medicare Wellness Visit. I appreciate your ongoing commitment to your health goals. Please review the following plan we discussed and let me know if I can assist you in the future.   These are the goals we discussed:  Goals   None     This is a list of the screening recommended for you and due dates:  Health Maintenance  Topic Date Due   Hepatitis C Screening: USPSTF Recommendation to screen - Ages 55-79 yo.  Never done   Zoster (Shingles) Vaccine (1 of 2) Never done    Tetanus Vaccine  02/23/2022*   Colon Cancer Screening  01/20/2026   Flu Shot  Completed   COVID-19 Vaccine  Completed   HPV Vaccine  Aged Out   Pneumonia Vaccine  Discontinued  *Topic was postponed. The date shown is not the original due date.

## 2021-11-17 DIAGNOSIS — J449 Chronic obstructive pulmonary disease, unspecified: Secondary | ICD-10-CM | POA: Diagnosis not present

## 2021-12-02 DIAGNOSIS — Z1159 Encounter for screening for other viral diseases: Secondary | ICD-10-CM | POA: Diagnosis not present

## 2021-12-03 LAB — HEPATITIS C ANTIBODY: Hep C Virus Ab: NONREACTIVE

## 2021-12-15 ENCOUNTER — Encounter: Payer: Self-pay | Admitting: Internal Medicine

## 2021-12-15 ENCOUNTER — Ambulatory Visit (INDEPENDENT_AMBULATORY_CARE_PROVIDER_SITE_OTHER): Payer: PPO | Admitting: Internal Medicine

## 2021-12-15 ENCOUNTER — Other Ambulatory Visit: Payer: Self-pay

## 2021-12-15 VITALS — BP 138/90 | HR 85 | Resp 18 | Ht 69.0 in | Wt 176.0 lb

## 2021-12-15 DIAGNOSIS — Z0001 Encounter for general adult medical examination with abnormal findings: Secondary | ICD-10-CM | POA: Diagnosis not present

## 2021-12-15 DIAGNOSIS — J449 Chronic obstructive pulmonary disease, unspecified: Secondary | ICD-10-CM | POA: Diagnosis not present

## 2021-12-15 DIAGNOSIS — J9611 Chronic respiratory failure with hypoxia: Secondary | ICD-10-CM

## 2021-12-15 DIAGNOSIS — J439 Emphysema, unspecified: Secondary | ICD-10-CM | POA: Diagnosis not present

## 2021-12-15 DIAGNOSIS — E559 Vitamin D deficiency, unspecified: Secondary | ICD-10-CM

## 2021-12-15 DIAGNOSIS — Z125 Encounter for screening for malignant neoplasm of prostate: Secondary | ICD-10-CM | POA: Diagnosis not present

## 2021-12-15 NOTE — Assessment & Plan Note (Signed)
Better with Judithann Sauger now ?Albuterol as needed ?Continue Singulair ?F/u with pulmonology ?

## 2021-12-15 NOTE — Assessment & Plan Note (Signed)
Ordered PSA after discussing its limitations for prostate cancer screening, including false positive results leading additional investigations. 

## 2021-12-15 NOTE — Assessment & Plan Note (Signed)
Due to COPD Uses O2 at nighttime 

## 2021-12-15 NOTE — Patient Instructions (Addendum)
Please continue using Breztri as prescribed. ? ?Please continue to follow heart healthy diet and perform moderate exercise/walking as tolerated. ?

## 2021-12-15 NOTE — Progress Notes (Signed)
? ?Established Patient Office Visit ? ?Subjective:  ?Patient ID: Jonathan Dean, male    DOB: May 24, 1951  Age: 71 y.o. MRN: [id number] ? ?CC:  ?Chief Complaint  ?Patient presents with  ? Annual Exam  ?  Annual exam   ? ? ?HPI ?Jonathan Dean is a 71 y.o. male with past medical history of hyperlipidemia, glaucoma and colon polyps who presents for annual physical. ? ?COPD: His breathing has improved since starting Belleair.  He has not required albuterol inhaler recently.  He denies any chest pain or palpitations.  He denies any fever, chills, sore throat, nasal congestion or postnasal drip.  He continues to take Singulair. ? ?Past Medical History:  ?Diagnosis Date  ? 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 Qualifier: Diagnosis of  By: Andria Frames MD, Gwyndolyn Saxon    ? Glaucoma   ? Hyperlipidemia   ? Polyp of colon, adenomatous 09/15/2011  ? Colonoscopy 11/07/11 removed at least two adenomatous polyps confirmed by surgical path.    ? ? ?Past Surgical History:  ?Procedure Laterality Date  ? COLONOSCOPY N/A 01/20/2021  ? Procedure: COLONOSCOPY;  Surgeon: Rogene Houston, MD;  [facility];  Service: Endoscopy;  Laterality: N/A;  am  ? POLYPECTOMY  01/20/2021  ? Procedure: POLYPECTOMY;  Surgeon: Rogene Houston, MD;  [facility];  Service: Endoscopy;;  ? ? ?History reviewed. No pertinent family history. ? ?Social History  ? ?Socioeconomic History  ? Marital status: Married  ?  Spouse name: Not on file  ? Number of children: Not on file  ? Years of education: Not on file  ? Highest education level: Not on file  ?Occupational History  ? Not on file  ?Tobacco Use  ? Smoking status: Former  ?  Packs/day: 1.50  ?  Years: 25.00  ?  Pack years: 37.50  ?  Types: Cigarettes  ?  Quit date: 09/14/2005  ?  Years since quitting: 16.2  ? Smokeless tobacco: Never  ?Vaping Use  ? Vaping Use: Never used  ?Substance and Sexual Activity  ? Alcohol use: Yes  ?  Alcohol/week: 2.0 standard drinks  ?  Types: 2 Standard drinks or  equivalent per week  ? Drug use: No  ? Sexual activity: Yes  ?  Partners: Female  ?  Comment: monogamous  ?Other Topics Concern  ? Not on file  ?Social History Narrative  ? Not on file  ? ?Social Determinants of Health  ? ?Financial Resource Strain: Not on file  ?Food Insecurity: Not on file  ?Transportation Needs: No Transportation Needs  ? Lack of Transportation (Medical): No  ? Lack of Transportation (Non-Medical): No  ?Physical Activity: Sufficiently Active  ? Days of Exercise per Week: 7 days  ? Minutes of Exercise per Session: 30 min  ?Stress: Not on file  ?Social Connections: Not on file  ?Intimate Partner Violence: Not on file  ? ? ?Outpatient Medications Prior to Visit  ?Medication Sig Dispense Refill  ? albuterol (VENTOLIN HFA) 108 (90 Base) MCG/ACT inhaler INHALE 2 PUFFS INTO THE LUNGS EVERY 6 HOURS AS NEEDED FOR WHEEZING OR SHORTNESS OF BREATH (Patient taking differently: Inhale 2 puffs into the lungs every 6 (six) hours as needed for wheezing or shortness of breath.) 6.7 g 2  ? Budeson-Glycopyrrol-Formoterol (BREZTRI AEROSPHERE) 160-9-4.8 MCG/ACT AERO Inhale 2 puffs into the lungs 2 (two) times daily. 10.7 g 11  ? dorzolamide-timolol (COSOPT) 22.3-6.8 MG/ML ophthalmic solution Place 1 drop into both eyes 2 (two) times daily.    ?  guaiFENesin 200 MG tablet Take 1 tablet (200 mg total) by mouth every 4 (four) hours as needed for cough or to loosen phlegm. 30 tablet 2  ? latanoprost (XALATAN) 0.005 % ophthalmic solution Place 1 drop into both eyes at bedtime.    ? montelukast (SINGULAIR) 10 MG tablet TAKE 1 TABLET(10 MG) BY MOUTH AT BEDTIME 90 tablet 3  ? Multiple Vitamins-Minerals (MULTIVITAMIN WITH MINERALS) tablet Take 1 tablet by mouth as needed.    ? COVID-19 mRNA bivalent vaccine, Pfizer, (PFIZER COVID-19 VAC BIVALENT) injection Inject into the muscle. (Patient not taking: Reported on 12/15/2021) 0.3 mL 0  ? ?No facility-administered medications prior to visit.  ? ? ?Allergies  ?Allergen Reactions  ?  Dorzolamide Hcl-Timolol Mal Other (See Comments)  ?  Swelling of eye lids only the [employer] brand of cosopt drug store is aware of this problem  ? ? ?ROS ?Review of Systems  ?Constitutional:  Negative for chills and fever.  ?HENT:  Negative for congestion and sore throat.   ?Eyes:  Negative for pain and discharge.  ?Respiratory:  Negative for cough and shortness of breath.   ?Cardiovascular:  Negative for chest pain and palpitations.  ?Gastrointestinal:  Negative for constipation, diarrhea, nausea and vomiting.  ?Endocrine: Negative for polydipsia and polyuria.  ?Genitourinary:  Negative for dysuria and hematuria.  ?Musculoskeletal:  Negative for neck pain and neck stiffness.  ?Skin:  Negative for rash.  ?Neurological:  Negative for dizziness, weakness, numbness and headaches.  ?Psychiatric/Behavioral:  Negative for agitation and behavioral problems.   ? ?  ?Objective:  ?  ?Physical Exam ?Vitals reviewed.  ?Constitutional:   ?   General: He is not in acute distress. ?   Appearance: He is not diaphoretic.  ?HENT:  ?   Head: Normocephalic and atraumatic.  ?   Nose: Nose normal.  ?   Mouth/Throat:  ?   Mouth: Mucous membranes are moist.  ?Eyes:  ?   General: No scleral icterus. ?   Extraocular Movements: Extraocular movements intact.  ?Cardiovascular:  ?   Rate and Rhythm: Normal rate and regular rhythm.  ?   Pulses: Normal pulses.  ?   Heart sounds: Normal heart sounds. No murmur heard. ?Pulmonary:  ?   Breath sounds: Normal breath sounds. No wheezing or rales.  ?Abdominal:  ?   Palpations: Abdomen is soft.  ?   Tenderness: There is no abdominal tenderness.  ?Musculoskeletal:  ?   Cervical back: Neck supple. No tenderness.  ?   Right lower leg: No edema.  ?   Left lower leg: No edema.  ?Skin: ?   General: Skin is warm.  ?   Findings: No rash.  ?Neurological:  ?   General: No focal deficit present.  ?   Mental Status: He is alert and oriented to person, place, and time.  ?   Cranial Nerves: No cranial nerve deficit.  ?    Sensory: No sensory deficit.  ?   Motor: No weakness.  ?Psychiatric:     ?   Mood and Affect: Mood normal.     ?   Behavior: Behavior normal.  ? ? ?BP 138/90 (BP Location: Left Arm, Patient Position: Sitting, Cuff Size: Normal)   Pulse 85   Resp 18   Ht $R'5\' 9"'FT$  (1.753 m)   Wt 176 lb (79.8 kg)   SpO2 97%   BMI 25.99 kg/m?  ?Wt Readings from Last 3 Encounters:  ?12/15/21 176 lb (79.8 kg)  ?09/05/21 174  lb 9.6 oz (79.2 kg)  ?07/22/21 173 lb 1.9 oz (78.5 kg)  ? ? ?Lab Results  ?Component Value Date  ? TSH 2.720 11/22/2020  ? ?Lab Results  ?Component Value Date  ? WBC 5.8 11/22/2020  ? HGB 14.9 11/22/2020  ? HCT 43.6 11/22/2020  ? MCV 93 11/22/2020  ? PLT 255 11/22/2020  ? ?Lab Results  ?Component Value Date  ? NA 136 12/13/2020  ? K 4.1 12/13/2020  ? CO2 27 12/13/2020  ? GLUCOSE 141 (H) 12/13/2020  ? BUN 13 12/13/2020  ? CREATININE 1.09 12/13/2020  ? BILITOT 0.4 11/22/2020  ? ALKPHOS 68 11/22/2020  ? AST 26 11/22/2020  ? ALT 40 11/22/2020  ? PROT 7.0 11/22/2020  ? ALBUMIN 4.2 11/22/2020  ? CALCIUM 9.9 12/13/2020  ? ANIONGAP 11 12/13/2020  ? EGFR 73 11/22/2020  ? ?Lab Results  ?Component Value Date  ? CHOL 249 (H) 11/22/2020  ? ?Lab Results  ?Component Value Date  ? HDL 61 11/22/2020  ? ?Lab Results  ?Component Value Date  ? LDLCALC 162 (H) 11/22/2020  ? ?Lab Results  ?Component Value Date  ? TRIG 146 11/22/2020  ? ?Lab Results  ?Component Value Date  ? CHOLHDL 4.1 11/22/2020  ? ?Lab Results  ?Component Value Date  ? HGBA1C 5.8 (H) 11/22/2020  ? ? ?  ?Assessment & Plan:  ? ?Problem List Items Addressed This Visit   ? ?  ? Respiratory  ? COPD (chronic obstructive pulmonary disease) ([place])  ?  Better with Judithann Sauger now ?Albuterol as needed ?Continue Singulair ?F/u with pulmonology ?  ?  ? Chronic respiratory failure with hypoxia ([facility])  ?  Due to COPD ?Uses O2 at nighttime ?  ?  ?  ? Other  ? Prostate cancer screening  ?  Ordered PSA after discussing its limitations for prostate cancer screening, including false positive  results leading additional investigations. ?  ?  ? Relevant Orders  ? PSA  ? Encounter for general adult medical examination with abnormal findings - Primary  ?  Annual exam as documented. ?Counseling done  re hea

## 2021-12-15 NOTE — Assessment & Plan Note (Signed)
Annual exam as documented. Counseling done  re healthy lifestyle involving commitment to 150 minutes exercise per week, heart healthy diet, and attaining healthy weight.The importance of adequate sleep also discussed. Changes in health habits are decided on by the patient with goals and time frames  set for achieving them. Immunization and cancer screening needs are specifically addressed at this visit. 

## 2021-12-26 DIAGNOSIS — H401121 Primary open-angle glaucoma, left eye, mild stage: Secondary | ICD-10-CM | POA: Diagnosis not present

## 2021-12-26 DIAGNOSIS — H401113 Primary open-angle glaucoma, right eye, severe stage: Secondary | ICD-10-CM | POA: Diagnosis not present

## 2022-01-06 DIAGNOSIS — H524 Presbyopia: Secondary | ICD-10-CM | POA: Diagnosis not present

## 2022-01-06 DIAGNOSIS — H52203 Unspecified astigmatism, bilateral: Secondary | ICD-10-CM | POA: Diagnosis not present

## 2022-01-06 DIAGNOSIS — H5203 Hypermetropia, bilateral: Secondary | ICD-10-CM | POA: Diagnosis not present

## 2022-01-10 ENCOUNTER — Other Ambulatory Visit: Payer: Self-pay | Admitting: Internal Medicine

## 2022-01-10 DIAGNOSIS — J40 Bronchitis, not specified as acute or chronic: Secondary | ICD-10-CM

## 2022-01-15 DIAGNOSIS — J449 Chronic obstructive pulmonary disease, unspecified: Secondary | ICD-10-CM | POA: Diagnosis not present

## 2022-01-23 DIAGNOSIS — Z125 Encounter for screening for malignant neoplasm of prostate: Secondary | ICD-10-CM | POA: Diagnosis not present

## 2022-01-23 DIAGNOSIS — E7800 Pure hypercholesterolemia, unspecified: Secondary | ICD-10-CM | POA: Diagnosis not present

## 2022-01-23 DIAGNOSIS — Z0001 Encounter for general adult medical examination with abnormal findings: Secondary | ICD-10-CM | POA: Diagnosis not present

## 2022-01-23 DIAGNOSIS — E559 Vitamin D deficiency, unspecified: Secondary | ICD-10-CM | POA: Diagnosis not present

## 2022-01-24 LAB — CBC WITH DIFFERENTIAL/PLATELET
Basophils Absolute: 0 10*3/uL (ref 0.0–0.2)
Basos: 1 %
EOS (ABSOLUTE): 0.3 10*3/uL (ref 0.0–0.4)
Eos: 7 %
Hematocrit: 42.7 % (ref 37.5–51.0)
Hemoglobin: 14.4 g/dL (ref 13.0–17.7)
Immature Grans (Abs): 0 10*3/uL (ref 0.0–0.1)
Immature Granulocytes: 0 %
Lymphocytes Absolute: 1.5 10*3/uL (ref 0.7–3.1)
Lymphs: 32 %
MCH: 31.9 pg (ref 26.6–33.0)
MCHC: 33.7 g/dL (ref 31.5–35.7)
MCV: 95 fL (ref 79–97)
Monocytes Absolute: 0.6 10*3/uL (ref 0.1–0.9)
Monocytes: 13 %
Neutrophils Absolute: 2.2 10*3/uL (ref 1.4–7.0)
Neutrophils: 47 %
Platelets: 246 10*3/uL (ref 150–450)
RBC: 4.51 x10E6/uL (ref 4.14–5.80)
RDW: 12.7 % (ref 11.6–15.4)
WBC: 4.7 10*3/uL (ref 3.4–10.8)

## 2022-01-24 LAB — CMP14+EGFR
ALT: 74 IU/L — ABNORMAL HIGH (ref 0–44)
AST: 39 IU/L (ref 0–40)
Albumin/Globulin Ratio: 1.9 (ref 1.2–2.2)
Albumin: 4.5 g/dL (ref 3.8–4.8)
Alkaline Phosphatase: 63 IU/L (ref 44–121)
BUN/Creatinine Ratio: 11 (ref 10–24)
BUN: 12 mg/dL (ref 8–27)
Bilirubin Total: 0.5 mg/dL (ref 0.0–1.2)
CO2: 29 mmol/L (ref 20–29)
Calcium: 9.6 mg/dL (ref 8.6–10.2)
Chloride: 98 mmol/L (ref 96–106)
Creatinine, Ser: 1.08 mg/dL (ref 0.76–1.27)
Globulin, Total: 2.4 g/dL (ref 1.5–4.5)
Glucose: 101 mg/dL — ABNORMAL HIGH (ref 70–99)
Potassium: 4.5 mmol/L (ref 3.5–5.2)
Sodium: 138 mmol/L (ref 134–144)
Total Protein: 6.9 g/dL (ref 6.0–8.5)
eGFR: 74 mL/min/{1.73_m2} (ref >59)

## 2022-01-24 LAB — LIPID PANEL
Chol/HDL Ratio: 4.3 ratio (ref 0.0–5.0)
Cholesterol, Total: 255 mg/dL — ABNORMAL HIGH (ref 100–199)
HDL: 59 mg/dL
LDL Chol Calc (NIH): 171 mg/dL — ABNORMAL HIGH (ref 0–99)
Triglycerides: 137 mg/dL (ref 0–149)
VLDL Cholesterol Cal: 25 mg/dL (ref 5–40)

## 2022-01-24 LAB — VITAMIN D 25 HYDROXY (VIT D DEFICIENCY, FRACTURES): Vit D, 25-Hydroxy: 29.3 ng/mL — ABNORMAL LOW (ref 30.0–100.0)

## 2022-01-24 LAB — PSA: Prostate Specific Ag, Serum: 3 ng/mL (ref 0.0–4.0)

## 2022-01-24 LAB — TSH: TSH: 3.3 u[IU]/mL (ref 0.450–4.500)

## 2022-01-24 LAB — HEMOGLOBIN A1C
Est. average glucose Bld gHb Est-mCnc: 114 mg/dL
Hgb A1c MFr Bld: 5.6 % (ref 4.8–5.6)

## 2022-02-14 DIAGNOSIS — J449 Chronic obstructive pulmonary disease, unspecified: Secondary | ICD-10-CM | POA: Diagnosis not present

## 2022-03-17 DIAGNOSIS — J449 Chronic obstructive pulmonary disease, unspecified: Secondary | ICD-10-CM | POA: Diagnosis not present

## 2022-04-16 DIAGNOSIS — J449 Chronic obstructive pulmonary disease, unspecified: Secondary | ICD-10-CM | POA: Diagnosis not present

## 2022-05-17 DIAGNOSIS — J449 Chronic obstructive pulmonary disease, unspecified: Secondary | ICD-10-CM | POA: Diagnosis not present

## 2022-05-22 ENCOUNTER — Other Ambulatory Visit: Payer: Self-pay | Admitting: *Deleted

## 2022-05-22 DIAGNOSIS — J209 Acute bronchitis, unspecified: Secondary | ICD-10-CM

## 2022-05-22 MED ORDER — BREZTRI AEROSPHERE 160-9-4.8 MCG/ACT IN AERO: 2.0000 | INHALATION_SPRAY | Freq: Two times a day (BID) | RESPIRATORY_TRACT | 11 refills | Status: DC

## 2022-06-05 DIAGNOSIS — H401121 Primary open-angle glaucoma, left eye, mild stage: Secondary | ICD-10-CM | POA: Diagnosis not present

## 2022-06-05 DIAGNOSIS — H401113 Primary open-angle glaucoma, right eye, severe stage: Secondary | ICD-10-CM | POA: Diagnosis not present

## 2022-06-17 DIAGNOSIS — J449 Chronic obstructive pulmonary disease, unspecified: Secondary | ICD-10-CM | POA: Diagnosis not present

## 2022-06-19 ENCOUNTER — Encounter: Payer: Self-pay | Admitting: Internal Medicine

## 2022-06-19 ENCOUNTER — Ambulatory Visit (INDEPENDENT_AMBULATORY_CARE_PROVIDER_SITE_OTHER): Payer: PPO | Admitting: Internal Medicine

## 2022-06-19 VITALS — BP 124/80 | HR 71 | Resp 18 | Ht 69.0 in | Wt 184.6 lb

## 2022-06-19 DIAGNOSIS — J9611 Chronic respiratory failure with hypoxia: Secondary | ICD-10-CM

## 2022-06-19 DIAGNOSIS — J439 Emphysema, unspecified: Secondary | ICD-10-CM

## 2022-06-19 DIAGNOSIS — R7401 Elevation of levels of liver transaminase levels: Secondary | ICD-10-CM | POA: Diagnosis not present

## 2022-06-19 DIAGNOSIS — E782 Mixed hyperlipidemia: Secondary | ICD-10-CM

## 2022-06-19 NOTE — Patient Instructions (Signed)
Please continue taking medications as prescribed.  Please consider getting Shingrix and Tdap vaccines at local pharmacy. 

## 2022-06-19 NOTE — Progress Notes (Signed)
Established Patient Office Visit  Subjective:  Patient ID: Jonathan Dean, male    DOB: [birth year]  Age: 71 y.o. MRN: [id number]  CC:  Chief Complaint  Patient presents with   Follow-up    Follow up     HPI Jonathan Dean is a 71 y.o. male with past medical history of COPD, hyperlipidemia, glaucoma and colon polyps who presents for f/u of his chronic medical conditions.  COPD: His breathing has improved since starting [employer].  He uses albuterol inhaler only before bedtime. He also use uses home O2 at nighttime. He denies any chest pain or palpitations.  He denies any fever, chills, sore throat, nasal congestion or postnasal drip.  He continues to take Singulair.    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      Past Surgical History:  Procedure Laterality Date   COLONOSCOPY N/A 01/20/2021   Procedure: COLONOSCOPY;  Surgeon: Rogene Houston, MD;  [facility];  Service: Endoscopy;  Laterality: N/A;  am   POLYPECTOMY  01/20/2021   Procedure: POLYPECTOMY;  Surgeon: Rogene Houston, MD;  Location: [facility];  Service: Endoscopy;;    History reviewed. No pertinent family history.  Social History   Socioeconomic History   Marital status: Married    Spouse name: Not on file   Number of children: Not on file   Years of education: Not on file   Highest education level: Not on file  Occupational History   Not on file  Tobacco Use   Smoking status: Former    Packs/day: 1.50    Years: 25.00    Total pack years: 37.50    Types: Cigarettes    Quit date: 09/14/2005    Years since quitting: 16.7   Smokeless tobacco: Never  Vaping Use   Vaping Use: Never used  Substance and Sexual Activity   Alcohol use: Yes     Alcohol/week: 2.0 standard drinks of alcohol    Types: 2 Standard drinks or equivalent per week   Drug use: No   Sexual activity: Yes    Partners: Female    Comment: monogamous  Other Topics Concern   Not on file  Social History Narrative   Not on file   Social Determinants of Health   Financial Resource Strain: Not on file  Food Insecurity: Not on file  Transportation Needs: No Transportation Needs (11/14/2021)   PRAPARE - Transportation    Lack of Transportation (Medical): No    Lack of Transportation (Non-Medical): No  Physical Activity: Sufficiently Active (11/14/2021)   Exercise Vital Sign    Days of Exercise per Week: 7 days    Minutes of Exercise per Session: 30 min  Stress: Not on file  Social Connections: Not on file  Intimate Partner Violence: Not on file    Outpatient Medications Prior to Visit  Medication Sig Dispense Refill   albuterol (VENTOLIN HFA) 108 (90 Base) MCG/ACT inhaler INHALE 2 PUFFS INTO THE LUNGS EVERY 6 HOURS AS NEEDED FOR WHEEZING OR SHORTNESS OF BREATH 6.7 g 2   Budeson-Glycopyrrol-Formoterol (BREZTRI AEROSPHERE) 160-9-4.8 MCG/ACT AERO Inhale 2 puffs into the lungs 2 (two) times daily. 10.7 g 11   dorzolamide-timolol (COSOPT) 22.3-6.8 MG/ML ophthalmic solution  Place 1 drop into both eyes 2 (two) times daily.     guaiFENesin 200 MG tablet Take 1 tablet (200 mg total) by mouth every 4 (four) hours as needed for cough or to loosen phlegm. 30 tablet 2   latanoprost (XALATAN) 0.005 % ophthalmic solution Place 1 drop into both eyes at bedtime.     montelukast (SINGULAIR) 10 MG tablet TAKE 1 TABLET(10 MG) BY MOUTH AT BEDTIME 90 tablet 3   Multiple Vitamins-Minerals (MULTIVITAMIN WITH MINERALS) tablet Take 1 tablet by mouth as needed.     No facility-administered medications prior to visit.    Allergies  Allergen Reactions   Dorzolamide Hcl-Timolol Mal Other (See Comments)    Swelling of eye lids only the B & L brand of cosopt drug store is aware of this  problem    ROS Review of Systems  Constitutional:  Negative for chills and fever.  HENT:  Negative for congestion and sore throat.   Eyes:  Negative for pain and discharge.  Respiratory:  Negative for cough and shortness of breath.   Cardiovascular:  Negative for chest pain and palpitations.  Gastrointestinal:  Negative for constipation, diarrhea, nausea and vomiting.  Endocrine: Negative for polydipsia and polyuria.  Genitourinary:  Negative for dysuria and hematuria.  Musculoskeletal:  Negative for neck pain and neck stiffness.  Skin:  Negative for rash.  Neurological:  Negative for dizziness, weakness, numbness and headaches.  Psychiatric/Behavioral:  Negative for agitation and behavioral problems.       Objective:    Physical Exam Vitals reviewed.  Constitutional:      General: He is not in acute distress.    Appearance: He is not diaphoretic.  HENT:     Head: Normocephalic and atraumatic.     Nose: Nose normal.     Mouth/Throat:     Mouth: Mucous membranes are moist.  Eyes:     General: No scleral icterus.    Extraocular Movements: Extraocular movements intact.  Cardiovascular:     Rate and Rhythm: Normal rate and regular rhythm.     Pulses: Normal pulses.     Heart sounds: Normal heart sounds. No murmur heard. Pulmonary:     Breath sounds: Normal breath sounds. No wheezing or rales.  Musculoskeletal:     Cervical back: Neck supple. No tenderness.     Right lower leg: No edema.     Left lower leg: No edema.  Skin:    General: Skin is warm.     Findings: No rash.  Neurological:     General: No focal deficit present.     Mental Status: He is alert and oriented to person, place, and time.     Sensory: No sensory deficit.     Motor: No weakness.  Psychiatric:        Mood and Affect: Mood normal.        Behavior: Behavior normal.     BP 124/80 (BP Location: Left Arm, Patient Position: Sitting, Cuff Size: Normal)   Pulse 71   Resp 18   Ht _0  (1.753 m)    Wt 184 lb 9.6 oz (83.7 kg)   SpO2 97%   BMI 27.26 kg/m  Wt Readings from Last 3 Encounters:  06/19/22 184 lb 9.6 oz (83.7 kg)  12/15/21 176 lb (79.8 kg)  09/05/21 174 lb 9.6 oz (79.2 kg)    Lab Results  Component Value Date   TSH 3.300 01/23/2022   Lab Results  Component Value Date  WBC 4.7 01/23/2022   HGB 14.4 01/23/2022   HCT 42.7 01/23/2022   MCV 95 01/23/2022   PLT 246 01/23/2022   Lab Results  Component Value Date   NA 138 01/23/2022   K 4.5 01/23/2022   CO2 29 01/23/2022   GLUCOSE 101 (H) 01/23/2022   BUN 12 01/23/2022   CREATININE 1.08 01/23/2022   BILITOT 0.5 01/23/2022   ALKPHOS 63 01/23/2022   AST 39 01/23/2022   ALT 74 (H) 01/23/2022   PROT 6.9 01/23/2022   ALBUMIN 4.5 01/23/2022   CALCIUM 9.6 01/23/2022   ANIONGAP 11 12/13/2020   EGFR 74 01/23/2022   Lab Results  Component Value Date   CHOL 255 (H) 01/23/2022   Lab Results  Component Value Date   HDL 59 01/23/2022   Lab Results  Component Value Date   LDLCALC 171 (H) 01/23/2022   Lab Results  Component Value Date   TRIG 137 01/23/2022   Lab Results  Component Value Date   CHOLHDL 4.3 01/23/2022   Lab Results  Component Value Date   HGBA1C 5.6 01/23/2022      Assessment & Plan:   Problem List Items Addressed This Visit       Respiratory   COPD (chronic obstructive pulmonary disease) ([place]) - Primary    Well-controlled with Breztri now Albuterol as needed Continue Singulair      Chronic respiratory failure with hypoxia (HCC)    Due to COPD Uses O2 at nighttime        Other   Mixed hyperlipidemia    Advised to follow low cholesterol diet for now BP wnl, no h/o DM      Relevant Orders   Lipid Profile   CMP14+EGFR   Elevated liver transaminase level    Last CMP showed elevated ALT Likely due to alcoholic beverage, takes about a beer almost every day      Relevant Orders   CMP14+EGFR    No orders of the defined types were placed in this  encounter.   Follow-up: Return in about 6 months (around 12/18/2022) for Annual physical.    Lindell Spar, MD

## 2022-06-19 NOTE — Assessment & Plan Note (Signed)
Last CMP showed elevated ALT Likely due to alcoholic beverage, takes about a beer almost every day 

## 2022-06-19 NOTE — Assessment & Plan Note (Addendum)
Advised to follow low cholesterol diet for now BP wnl, no h/o DM

## 2022-06-19 NOTE — Assessment & Plan Note (Signed)
Well-controlled with Breztri now Albuterol as needed Continue Singulair 

## 2022-06-19 NOTE — Assessment & Plan Note (Signed)
Due to COPD Uses O2 at nighttime 

## 2022-07-04 ENCOUNTER — Other Ambulatory Visit: Payer: Self-pay | Admitting: Orthopaedic Surgery

## 2022-07-14 DIAGNOSIS — E782 Mixed hyperlipidemia: Secondary | ICD-10-CM | POA: Diagnosis not present

## 2022-07-15 LAB — CMP14+EGFR
ALT: 82 IU/L — ABNORMAL HIGH (ref 0–44)
AST: 46 IU/L — ABNORMAL HIGH (ref 0–40)
Albumin/Globulin Ratio: 2 (ref 1.2–2.2)
Albumin: 4.5 g/dL (ref 3.8–4.8)
Alkaline Phosphatase: 64 IU/L (ref 44–121)
BUN/Creatinine Ratio: 8 — ABNORMAL LOW (ref 10–24)
BUN: 11 mg/dL (ref 8–27)
Bilirubin Total: 0.6 mg/dL (ref 0.0–1.2)
CO2: 25 mmol/L (ref 20–29)
Calcium: 9.2 mg/dL (ref 8.6–10.2)
Chloride: 98 mmol/L (ref 96–106)
Creatinine, Ser: 1.35 mg/dL — ABNORMAL HIGH (ref 0.76–1.27)
Globulin, Total: 2.2 g/dL (ref 1.5–4.5)
Glucose: 103 mg/dL — ABNORMAL HIGH (ref 70–99)
Potassium: 4.5 mmol/L (ref 3.5–5.2)
Sodium: 136 mmol/L (ref 134–144)
Total Protein: 6.7 g/dL (ref 6.0–8.5)
eGFR: 56 mL/min/{1.73_m2} — ABNORMAL LOW (ref >59)

## 2022-07-15 LAB — LIPID PANEL
Chol/HDL Ratio: 5.3 ratio — ABNORMAL HIGH (ref 0.0–5.0)
Cholesterol, Total: 251 mg/dL — ABNORMAL HIGH (ref 100–199)
HDL: 47 mg/dL (ref >39)
LDL Chol Calc (NIH): 174 mg/dL — ABNORMAL HIGH (ref 0–99)
Triglycerides: 165 mg/dL — ABNORMAL HIGH (ref 0–149)
VLDL Cholesterol Cal: 30 mg/dL (ref 5–40)

## 2022-07-17 DIAGNOSIS — J449 Chronic obstructive pulmonary disease, unspecified: Secondary | ICD-10-CM | POA: Diagnosis not present

## 2022-08-17 DIAGNOSIS — J449 Chronic obstructive pulmonary disease, unspecified: Secondary | ICD-10-CM | POA: Diagnosis not present

## 2022-09-16 DIAGNOSIS — J449 Chronic obstructive pulmonary disease, unspecified: Secondary | ICD-10-CM | POA: Diagnosis not present

## 2022-10-13 ENCOUNTER — Encounter: Payer: Self-pay | Admitting: Internal Medicine

## 2022-10-14 ENCOUNTER — Other Ambulatory Visit: Payer: Self-pay | Admitting: Internal Medicine

## 2022-10-14 DIAGNOSIS — R0609 Other forms of dyspnea: Secondary | ICD-10-CM

## 2022-10-17 ENCOUNTER — Encounter: Payer: Self-pay | Admitting: Internal Medicine

## 2022-10-17 ENCOUNTER — Ambulatory Visit (INDEPENDENT_AMBULATORY_CARE_PROVIDER_SITE_OTHER): Payer: PPO | Admitting: Internal Medicine

## 2022-10-17 VITALS — BP 140/84 | HR 79 | Ht 69.0 in | Wt 184.6 lb

## 2022-10-17 DIAGNOSIS — J9611 Chronic respiratory failure with hypoxia: Secondary | ICD-10-CM

## 2022-10-17 DIAGNOSIS — J449 Chronic obstructive pulmonary disease, unspecified: Secondary | ICD-10-CM | POA: Diagnosis not present

## 2022-10-17 DIAGNOSIS — N183 Chronic kidney disease, stage 3 unspecified: Secondary | ICD-10-CM | POA: Insufficient documentation

## 2022-10-17 DIAGNOSIS — J439 Emphysema, unspecified: Secondary | ICD-10-CM | POA: Diagnosis not present

## 2022-10-17 DIAGNOSIS — N179 Acute kidney failure, unspecified: Secondary | ICD-10-CM | POA: Diagnosis not present

## 2022-10-17 DIAGNOSIS — E782 Mixed hyperlipidemia: Secondary | ICD-10-CM | POA: Diagnosis not present

## 2022-10-17 DIAGNOSIS — R7401 Elevation of levels of liver transaminase levels: Secondary | ICD-10-CM

## 2022-10-17 NOTE — Assessment & Plan Note (Signed)
Last CMP showed rise in S. Cr. and GFR was 56 Recheck CMP Advised to maintain adequate hydration Avoid nephrotoxic agents

## 2022-10-17 NOTE — Assessment & Plan Note (Signed)
Last CMP showed elevated ALT Likely due to alcoholic beverage, takes about a beer almost every day

## 2022-10-17 NOTE — Progress Notes (Signed)
Established Patient Office Visit  Subjective:  Patient ID: Jonathan Dean, male    DOB: [birth year]  Age: 72 y.o. MRN: [id number]  CC:  Chief Complaint  Patient presents with   Follow-up    HPI Jonathan Dean is a 72 y.o. male with past medical history of COPD, hyperlipidemia, glaucoma and colon polyps who presents for f/u of his chronic medical conditions.  His last CMP showed elevated liver enzymes.  He reports taking of a bear almost every day.  Denies binge drinking.  His creatinine was also slightly elevated compared to prior.  He reports adequate fluid intake and takes multivitamin supplement.  His BP was borderline elevated today.  He reports that he had to rush to the appointment today.  He denies any headache, dizziness, chest pain palpitations.  COPD: His breathing is improved with Breztri.  He uses albuterol inhaler only before bedtime. He also use uses home O2 at nighttime. He denies any chest pain or palpitations.  He denies any fever, chills, sore throat, nasal congestion or postnasal drip.  He continues to take Singulair.    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      Past Surgical History:  Procedure Laterality Date   COLONOSCOPY N/A 01/20/2021   Procedure: COLONOSCOPY;  Surgeon: Rogene Houston, MD;  [facility];  Service: Endoscopy;  Laterality: N/A;  am   POLYPECTOMY  01/20/2021   Procedure: POLYPECTOMY;  Surgeon: Rogene Houston, MD;  [facility];  Service: Endoscopy;;    History reviewed. No pertinent family history.  Social History   Socioeconomic History   Marital status: Married    Spouse name: Not on file   Number of children: Not on file   Years of education: Not on file    Highest education level: Not on file  Occupational History   Not on file  Tobacco Use   Smoking status: Former    Packs/day: 1.50    Years: 25.00    Total pack years: 37.50    Types: Cigarettes    Quit date: 09/14/2005    Years since quitting: 17.1   Smokeless tobacco: Never  Vaping Use   Vaping Use: Never used  Substance and Sexual Activity   Alcohol use: Yes    Alcohol/week: 2.0 standard drinks of alcohol    Types: 2 Standard drinks or equivalent per week   Drug use: No   Sexual activity: Yes    Partners: Female    Comment: monogamous  Other Topics Concern   Not on file  Social History Narrative   Not on file   Social Determinants of Health   Financial Resource Strain: Not on file  Food Insecurity: Not on file  Transportation Needs: No Transportation Needs (11/14/2021)   PRAPARE - Transportation    Lack of Transportation (Medical): No    Lack of Transportation (Non-Medical): No  Physical Activity: Sufficiently Active (11/14/2021)   Exercise Vital Sign    Days of Exercise per Week: 7 days    Minutes of Exercise per Session: 30 min  Stress: Not on file  Social Connections: Not on file  Intimate Partner Violence: Not on file    Outpatient Medications Prior to Visit  Medication Sig Dispense Refill   albuterol (VENTOLIN HFA) 108 (90 Base) MCG/ACT inhaler INHALE 2 PUFFS INTO THE LUNGS EVERY 6 HOURS AS NEEDED FOR WHEEZING OR SHORTNESS OF BREATH 6.7 g 2   Budeson-Glycopyrrol-Formoterol (BREZTRI AEROSPHERE) 160-9-4.8 MCG/ACT AERO Inhale 2 puffs into the lungs 2 (two) times daily. 10.7 g 11   dorzolamide-timolol (COSOPT) 22.3-6.8 MG/ML ophthalmic solution Place 1 drop into both eyes 2 (two) times daily.     guaiFENesin 200 MG tablet Take 1 tablet (200 mg total) by mouth every 4 (four) hours as needed for cough or to loosen phlegm. 30 tablet 2   latanoprost (XALATAN) 0.005 % ophthalmic solution Place 1 drop into both eyes at bedtime.     montelukast (SINGULAIR) 10 MG tablet  TAKE 1 TABLET(10 MG) BY MOUTH AT BEDTIME 90 tablet 3   Multiple Vitamins-Minerals (MULTIVITAMIN WITH MINERALS) tablet Take 1 tablet by mouth as needed.     No facility-administered medications prior to visit.    Allergies  Allergen Reactions   Dorzolamide Hcl-Timolol Mal Other (See Comments)    Swelling of eye lids only the B [employer] L brand of cosopt drug store is aware of this problem    ROS Review of Systems  Constitutional:  Negative for chills and fever.  HENT:  Negative for congestion and sore throat.   Eyes:  Negative for pain and discharge.  Respiratory:  Negative for cough and shortness of breath.   Cardiovascular:  Negative for chest pain and palpitations.  Gastrointestinal:  Negative for constipation, diarrhea, nausea and vomiting.  Endocrine: Negative for polydipsia and polyuria.  Genitourinary:  Negative for dysuria and hematuria.  Musculoskeletal:  Negative for neck pain and neck stiffness.  Skin:  Negative for rash.  Neurological:  Negative for dizziness, weakness, numbness and headaches.  Psychiatric/Behavioral:  Negative for agitation and behavioral problems.       Objective:    Physical Exam Vitals reviewed.  Constitutional:      General: He is not in acute distress.    Appearance: He is not diaphoretic.  HENT:     Head: Normocephalic and atraumatic.     Nose: Nose normal.     Mouth/Throat:     Mouth: Mucous membranes are moist.  Eyes:     General: No scleral icterus.    Extraocular Movements: Extraocular movements intact.  Cardiovascular:     Rate and Rhythm: Normal rate and regular rhythm.     Pulses: Normal pulses.     Heart sounds: Normal heart sounds. No murmur heard. Pulmonary:     Breath sounds: Normal breath sounds. No wheezing or rales.  Musculoskeletal:     Cervical back: Neck supple. No tenderness.     Right lower leg: No edema.     Left lower leg: No edema.  Skin:    General: Skin is warm.     Findings: No rash.  Neurological:      General: No focal deficit present.     Mental Status: He is alert and oriented to person, place, and time.     Sensory: No sensory deficit.     Motor: No weakness.  Psychiatric:        Mood and Affect: Mood normal.        Behavior: Behavior normal.     BP (!) 140/84 (BP Location: Left Arm, Cuff Size: Normal)   Pulse 79   Ht '5\' 9"'$  (1.753 m)   Wt 184 lb 9.6 oz (83.7 kg)   SpO2 98%  BMI 27.26 kg/m  Wt Readings from Last 3 Encounters:  10/17/22 184 lb 9.6 oz (83.7 kg)  06/19/22 184 lb 9.6 oz (83.7 kg)  12/15/21 176 lb (79.8 kg)    Lab Results  Component Value Date   TSH 3.300 01/23/2022   Lab Results  Component Value Date   WBC 4.7 01/23/2022   HGB 14.4 01/23/2022   HCT 42.7 01/23/2022   MCV 95 01/23/2022   PLT 246 01/23/2022   Lab Results  Component Value Date   NA 136 07/14/2022   K 4.5 07/14/2022   CO2 25 07/14/2022   GLUCOSE 103 (H) 07/14/2022   BUN 11 07/14/2022   CREATININE 1.35 (H) 07/14/2022   BILITOT 0.6 07/14/2022   ALKPHOS 64 07/14/2022   AST 46 (H) 07/14/2022   ALT 82 (H) 07/14/2022   PROT 6.7 07/14/2022   ALBUMIN 4.5 07/14/2022   CALCIUM 9.2 07/14/2022   ANIONGAP 11 12/13/2020   EGFR 56 (L) 07/14/2022   Lab Results  Component Value Date   CHOL 251 (H) 07/14/2022   Lab Results  Component Value Date   HDL 47 07/14/2022   Lab Results  Component Value Date   LDLCALC 174 (H) 07/14/2022   Lab Results  Component Value Date   TRIG 165 (H) 07/14/2022   Lab Results  Component Value Date   CHOLHDL 5.3 (H) 07/14/2022   Lab Results  Component Value Date   HGBA1C 5.6 01/23/2022      Assessment & Plan:   Problem List Items Addressed This Visit       Respiratory   COPD (chronic obstructive pulmonary disease) ([place])    Well-controlled with Breztri now Albuterol as needed Continue Singulair      Chronic respiratory failure with hypoxia (HCC)    Due to COPD Uses O2 at nighttime        Genitourinary   AKI (acute kidney injury)  ([place]) - Primary    Last CMP showed rise in S. Cr. and GFR was 56 Recheck CMP Advised to maintain adequate hydration Avoid nephrotoxic agents        Other   Mixed hyperlipidemia    Advised to follow low cholesterol diet for now BP wnl, no h/o DM Plan to start statin if persistently elevated LDL - but need to be cautious due to elevated liver enzymes      Relevant Orders   Lipid Profile   Elevated liver transaminase level    Last CMP showed elevated ALT Likely due to alcoholic beverage, takes about a beer almost every day      Relevant Orders   CMP14+EGFR    No orders of the defined types were placed in this encounter.   Follow-up: Return in about 4 months (around 02/15/2023) for Annual physical.    Lindell Spar, MD

## 2022-10-17 NOTE — Assessment & Plan Note (Signed)
Well-controlled with Judithann Sauger now Albuterol as needed Continue Singulair

## 2022-10-17 NOTE — Assessment & Plan Note (Signed)
Advised to follow low cholesterol diet for now BP wnl, no h/o DM Plan to start statin if persistently elevated LDL - but need to be cautious due to elevated liver enzymes

## 2022-10-17 NOTE — Patient Instructions (Signed)
Please follow DASH diet and perform moderate exercise/walking at least 150 mins/week.

## 2022-10-17 NOTE — Assessment & Plan Note (Signed)
Due to COPD Uses O2 at nighttime

## 2022-10-18 ENCOUNTER — Other Ambulatory Visit: Payer: Self-pay | Admitting: Internal Medicine

## 2022-10-18 DIAGNOSIS — R7401 Elevation of levels of liver transaminase levels: Secondary | ICD-10-CM

## 2022-10-18 LAB — CMP14+EGFR
ALT: 128 IU/L — ABNORMAL HIGH (ref 0–44)
AST: 75 IU/L — ABNORMAL HIGH (ref 0–40)
Albumin/Globulin Ratio: 1.6 (ref 1.2–2.2)
Albumin: 4.3 g/dL (ref 3.8–4.8)
Alkaline Phosphatase: 76 IU/L (ref 44–121)
BUN/Creatinine Ratio: 9 — ABNORMAL LOW (ref 10–24)
BUN: 11 mg/dL (ref 8–27)
Bilirubin Total: 0.5 mg/dL (ref 0.0–1.2)
CO2: 25 mmol/L (ref 20–29)
Calcium: 9.8 mg/dL (ref 8.6–10.2)
Chloride: 99 mmol/L (ref 96–106)
Creatinine, Ser: 1.24 mg/dL (ref 0.76–1.27)
Globulin, Total: 2.7 g/dL (ref 1.5–4.5)
Glucose: 91 mg/dL (ref 70–99)
Potassium: 4.2 mmol/L (ref 3.5–5.2)
Sodium: 138 mmol/L (ref 134–144)
Total Protein: 7 g/dL (ref 6.0–8.5)
eGFR: 62 mL/min/{1.73_m2} (ref >59)

## 2022-10-18 LAB — LIPID PANEL
Chol/HDL Ratio: 5.6 ratio — ABNORMAL HIGH (ref 0.0–5.0)
Cholesterol, Total: 262 mg/dL — ABNORMAL HIGH (ref 100–199)
HDL: 47 mg/dL (ref >39)
LDL Chol Calc (NIH): 172 mg/dL — ABNORMAL HIGH (ref 0–99)
Triglycerides: 229 mg/dL — ABNORMAL HIGH (ref 0–149)
VLDL Cholesterol Cal: 43 mg/dL — ABNORMAL HIGH (ref 5–40)

## 2022-10-25 ENCOUNTER — Ambulatory Visit (HOSPITAL_COMMUNITY)
Admission: RE | Admit: 2022-10-25 | Discharge: 2022-10-25 | Disposition: A | Discharge status: Home / Self Care | Payer: PPO | Source: Ambulatory Visit | Attending: Internal Medicine | Admitting: Internal Medicine

## 2022-10-25 DIAGNOSIS — R7401 Elevation of levels of liver transaminase levels: Secondary | ICD-10-CM | POA: Insufficient documentation

## 2022-10-25 DIAGNOSIS — R945 Abnormal results of liver function studies: Secondary | ICD-10-CM | POA: Diagnosis not present

## 2022-11-06 DIAGNOSIS — H401121 Primary open-angle glaucoma, left eye, mild stage: Secondary | ICD-10-CM | POA: Diagnosis not present

## 2022-11-06 DIAGNOSIS — H401113 Primary open-angle glaucoma, right eye, severe stage: Secondary | ICD-10-CM | POA: Diagnosis not present

## 2022-11-17 DIAGNOSIS — J449 Chronic obstructive pulmonary disease, unspecified: Secondary | ICD-10-CM | POA: Diagnosis not present

## 2022-11-28 ENCOUNTER — Encounter: Payer: Self-pay | Admitting: Internal Medicine

## 2022-11-28 ENCOUNTER — Ambulatory Visit (INDEPENDENT_AMBULATORY_CARE_PROVIDER_SITE_OTHER): Payer: PPO | Admitting: Internal Medicine

## 2022-11-28 DIAGNOSIS — Z0000 Encounter for general adult medical examination without abnormal findings: Secondary | ICD-10-CM

## 2022-11-28 NOTE — Patient Instructions (Addendum)
  Jonathan Dean , Thank you for taking time to come for your Medicare Wellness Visit. I appreciate your ongoing commitment to your health goals. Please review the following plan we discussed and let me know if I can assist you in the future.   These are the goals we discussed:Patient will continue to go to gym twice weekly.    This is a list of the screening recommended for you and due dates:  Health Maintenance  Topic Date Due   DTaP/Tdap/Td vaccine (3 - Tdap) 06/29/2018   COVID-19 Vaccine (5 - 2023-24 season) 05/26/2022   Zoster (Shingles) Vaccine (2 of 2) 08/25/2022   Medicare Annual Wellness Visit  11/14/2022   Colon Cancer Screening  01/20/2026   Flu Shot  Completed   Hepatitis C Screening: USPSTF Recommendation to screen - Ages 18-79 yo.  Completed   HPV Vaccine  Aged Out   Pneumonia Vaccine  Discontinued

## 2022-11-28 NOTE — Progress Notes (Signed)
Subjective:  This is a telephone encounter between Jonathan Dean and Jonathan Dean on 11/28/2022 for AWV. The visit was conducted with the patient located at home and Jonathan Dean at [facility]. The patient's identity was confirmed using their DOB and current address. The patient has consented to being evaluated through a telephone encounter and understands the associated risks (an examination cannot be done and the patient may need to come in for an appointment) / benefits (allows the patient to remain at home, decreasing exposure to coronavirus).     Jonathan Dean is a 72 y.o. male who presents for Medicare Annual/Subsequent preventive examination.  Review of Systems    Review of Systems  All other systems reviewed and are negative.  Objective:    There were no vitals filed for this visit. There is no height or weight on file to calculate BMI.     11/14/2021    8:51 AM 01/20/2021    8:38 AM  Advanced Directives  Does Patient Have a Medical Advance Directive? No No  Would patient like information on creating a medical advance directive? Yes (ED - Information included in AVS) No - Patient declined    Current Medications (verified) Outpatient Encounter Medications as of 11/28/2022  Medication Sig   albuterol (VENTOLIN HFA) 108 (90 Base) MCG/ACT inhaler INHALE 2 PUFFS INTO THE LUNGS EVERY 6 HOURS AS NEEDED FOR WHEEZING OR SHORTNESS OF BREATH   Budeson-Glycopyrrol-Formoterol (BREZTRI AEROSPHERE) 160-9-4.8 MCG/ACT AERO Inhale 2 puffs into the lungs 2 (two) times daily.   dorzolamide-timolol (COSOPT) 22.3-6.8 MG/ML ophthalmic solution Place 1 drop into both eyes 2 (two) times daily.   guaiFENesin 200 MG tablet Take 1 tablet (200 mg total) by mouth every 4 (four) hours as needed for cough or to loosen phlegm.   latanoprost (XALATAN) 0.005 % ophthalmic solution Place 1 drop into both eyes at bedtime.   montelukast (SINGULAIR) 10 MG tablet TAKE 1 TABLET(10 MG) BY MOUTH AT BEDTIME   Multiple  Vitamins-Minerals (MULTIVITAMIN WITH MINERALS) tablet Take 1 tablet by mouth as needed.   No facility-administered encounter medications on file as of 11/28/2022.    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     Past Surgical History:  Procedure Laterality Date   COLONOSCOPY N/A 01/20/2021   Procedure: COLONOSCOPY;  Surgeon: Rogene Houston, MD;  [facility];  Service: Endoscopy;  Laterality: N/A;  am   POLYPECTOMY  01/20/2021   Procedure: POLYPECTOMY;  Surgeon: Rogene Houston, MD;  Location: [facility];  Service: Endoscopy;;   No family history on file. Social History   Socioeconomic History   Marital status: Married    Spouse name: Not on file   Number of children: Not on file   Years of education: Not on file   Highest education level: Not on file  Occupational History   Not on file  Tobacco Use   Smoking status: Former    Packs/day: 1.50    Years: 25.00    Total pack years: 37.50    Types: Cigarettes    Quit date: 09/14/2005    Years since quitting: 17.2   Smokeless tobacco: Never  Vaping Use  Vaping Use: Never used  Substance and Sexual Activity   Alcohol use: Yes    Alcohol/week: 2.0 standard drinks of alcohol    Types: 2 Standard drinks or equivalent per week   Drug use: No   Sexual activity: Yes    Partners: Female    Comment: monogamous  Other Topics Concern   Not on file  Social History Narrative   Not on file   Social Determinants of Health   Financial Resource Strain: Not on file  Food Insecurity: Not on file  Transportation Needs: No Transportation Needs (11/14/2021)   PRAPARE - Transportation    Lack of Transportation  (Medical): No    Lack of Transportation (Non-Medical): No  Physical Activity: Sufficiently Active (11/14/2021)   Exercise Vital Sign    Days of Exercise per Week: 7 days    Minutes of Exercise per Session: 30 min  Stress: Not on file  Social Connections: Not on file    Tobacco Counseling Counseling given: Not Answered   Clinical Intake:  Pre-visit preparation completed: Yes  Pain : No/denies pain     Diabetes: No  How often do you need to have someone help you when you read instructions, pamphlets, or other written materials from your doctor or pharmacy?: 1 - Never What is the last grade level you completed in school?: 12th grade   Activities of Daily Living    11/28/2022    1:07 PM  In your present state of health, do you have any difficulty performing the following activities:  Hearing? 0  Vision? 0  Difficulty concentrating or making decisions? 0  Walking or climbing stairs? 0  Dressing or bathing? 0  Doing errands, shopping? 0    Patient Care Team: Lindell Spar, MD as PCP - General (Internal Medicine)  Indicate any recent Medical Services you may have received from other than [facility] providers in the past year (date may be approximate).     Assessment:   This is a routine wellness examination for Jonathan Dean.  Hearing/Vision screen No results found.  Dietary issues and exercise activities discussed:     Goals Addressed   None    Depression Screen    11/28/2022    1:07 PM 10/17/2022    8:50 AM 06/19/2022    8:42 AM 12/15/2021    1:08 PM 06/16/2021    1:06 PM 06/08/2021    8:51 AM 05/23/2021   11:35 AM  PHQ 2/9 Scores  PHQ - 2 Score 0 0 0 0 0 1 0  PHQ- 9 Score   0        Fall Risk    11/28/2022    1:04 PM 10/17/2022    8:50 AM 06/19/2022    8:42 AM 12/15/2021    1:08 PM 11/14/2021    8:49 AM  [place] in the past year? 0 0 0 0 0  Number falls in past yr: 0 0 0 0 0  Injury with Fall? 0 0 0 0 0  Risk for fall due to :   No Fall Risks No Fall  Risks   Follow up   Falls evaluation completed Falls evaluation completed     FALL RISK PREVENTION PERTAINING TO THE HOME:  Any stairs in or around the home? Yes  If so, are there any without handrails? Yes  Home free of loose throw rugs in walkways, pet beds, electrical cords, etc? Yes  Adequate lighting in your home to reduce risk of falls?  Yes   ASSISTIVE DEVICES UTILIZED TO PREVENT FALLS:  Life alert? No  Use of a cane, walker or w/c? No  Grab bars in the bathroom? No  Shower chair or bench in shower? No  Elevated toilet seat or a handicapped toilet? No    Cognitive Function:        11/28/2022    1:07 PM 11/14/2021    8:53 AM  6CIT Screen  What Year? 0 points 0 points  What month? 0 points 0 points  What time? 0 points 0 points  Count back from 20 0 points 0 points  Months in reverse 0 points 0 points  Repeat phrase 2 points 0 points  Total Score 2 points 0 points    Immunizations Immunization History  Administered Date(s) Administered   Fluad Quad(high Dose 65+) 06/25/2020, 06/16/2021   Influenza Split 10/04/2012   Influenza Whole 06/30/2009   Influenza, Quadrivalent, Recombinant, Inj, Pf 06/08/2022   Influenza,inj,Quad PF,6+ Mos 08/01/2014   PFIZER(Purple Top)SARS-COV-2 Vaccination 12/18/2019, 01/10/2020, 11/03/2020   Pfizer Covid-19 Vaccine Bivalent Booster 12yr & up 08/03/2021   Pneumococcal Conjugate-13 07/26/2020   Td 09/25/1997, 06/29/2008   Tdap 06/25/2022   Unspecified SARS-COV-2 Vaccination 06/25/2022   Zoster Recombinat (Shingrix) 06/30/2022, 11/14/2022    TDAP status: Up to date   Flu Vaccine status: Up to date  Pneumococcal vaccine status: Up to date  Covid-19 vaccine status: Completed vaccines  Qualifies for Shingles Vaccine? Yes   Zostavax completed No   Shingrix Completed?: Yes  Screening Tests Health Maintenance  Topic Date Due   COVID-19 Vaccine (6 - 2023-24 season) 08/20/2022   Medicare Annual Wellness (AWV)  11/14/2022    COLONOSCOPY (Pts 45-443yrInsurance coverage will need to be confirmed)  01/20/2026   DTaP/Tdap/Td (4 - Td or Tdap) 06/25/2032   INFLUENZA VACCINE  Completed   Hepatitis C Screening  Completed   Zoster Vaccines- Shingrix  Completed   HPV VACCINES  Aged Out   Pneumonia Vaccine 6580Years old  Discontinued    Health Maintenance  Health Maintenance Due  Topic Date Due   COVID-19 Vaccine (6 - 2023-24 season) 08/20/2022   Medicare Annual Wellness (AWV)  11/14/2022    Colorectal cancer screening: Type of screening: Colonoscopy. Completed 01/20/2021. Repeat every 5 years  Lung Cancer Screening: (Low Dose CT Chest recommended if Age 489-80ears, 30 pack-year currently smoking OR have quit w/in 15years.) does not qualify.     Additional Screening:  Hepatitis C Screening: does not qualify; Completed 12/02/2021  Vision Screening: Recommended annual ophthalmology exams for early detection of glaucoma and other disorders of the eye. Is the patient up to date with their annual eye exam?  Yes  Who is the provider or what is the name of the office in which the patient attends annual eye exams? [facility], [facility] pt is not established with a provider, would they like to be referred to a provider to establish care? No .   Dental Screening: Recommended annual dental exams for proper oral hygiene  Community Resource Referral / Chronic Care Management: CRR required this visit?  No   CCM required this visit?  No      Plan:     I have personally reviewed and noted the following in the patient's chart:   Medical and social history Use of alcohol, tobacco or illicit drugs  Current medications and supplements including opioid prescriptions. Patient is not currently taking opioid prescriptions. Functional ability and status Nutritional status Physical activity Advanced  directives List of other physicians Hospitalizations, surgeries, and ER visits in previous 12 months Vitals Screenings to  include cognitive, depression, and falls Referrals and appointments  In addition, I have reviewed and discussed with patient certain preventive protocols, quality metrics, and best practice recommendations. A written personalized care plan for preventive services as well as general preventive health recommendations were provided to patient.     Jonathan Dy, MD   11/28/2022

## 2022-12-12 IMAGING — DX DG CHEST 2V
2 series · 2 of 2 positions shown · non-contrast
Comparison: Chest x-ray 09/30/2020.

CLINICAL DATA: 69-year-old male with history of viral infection in
[REDACTED].

EXAM:
CHEST - 2 VIEW

[chest pa]
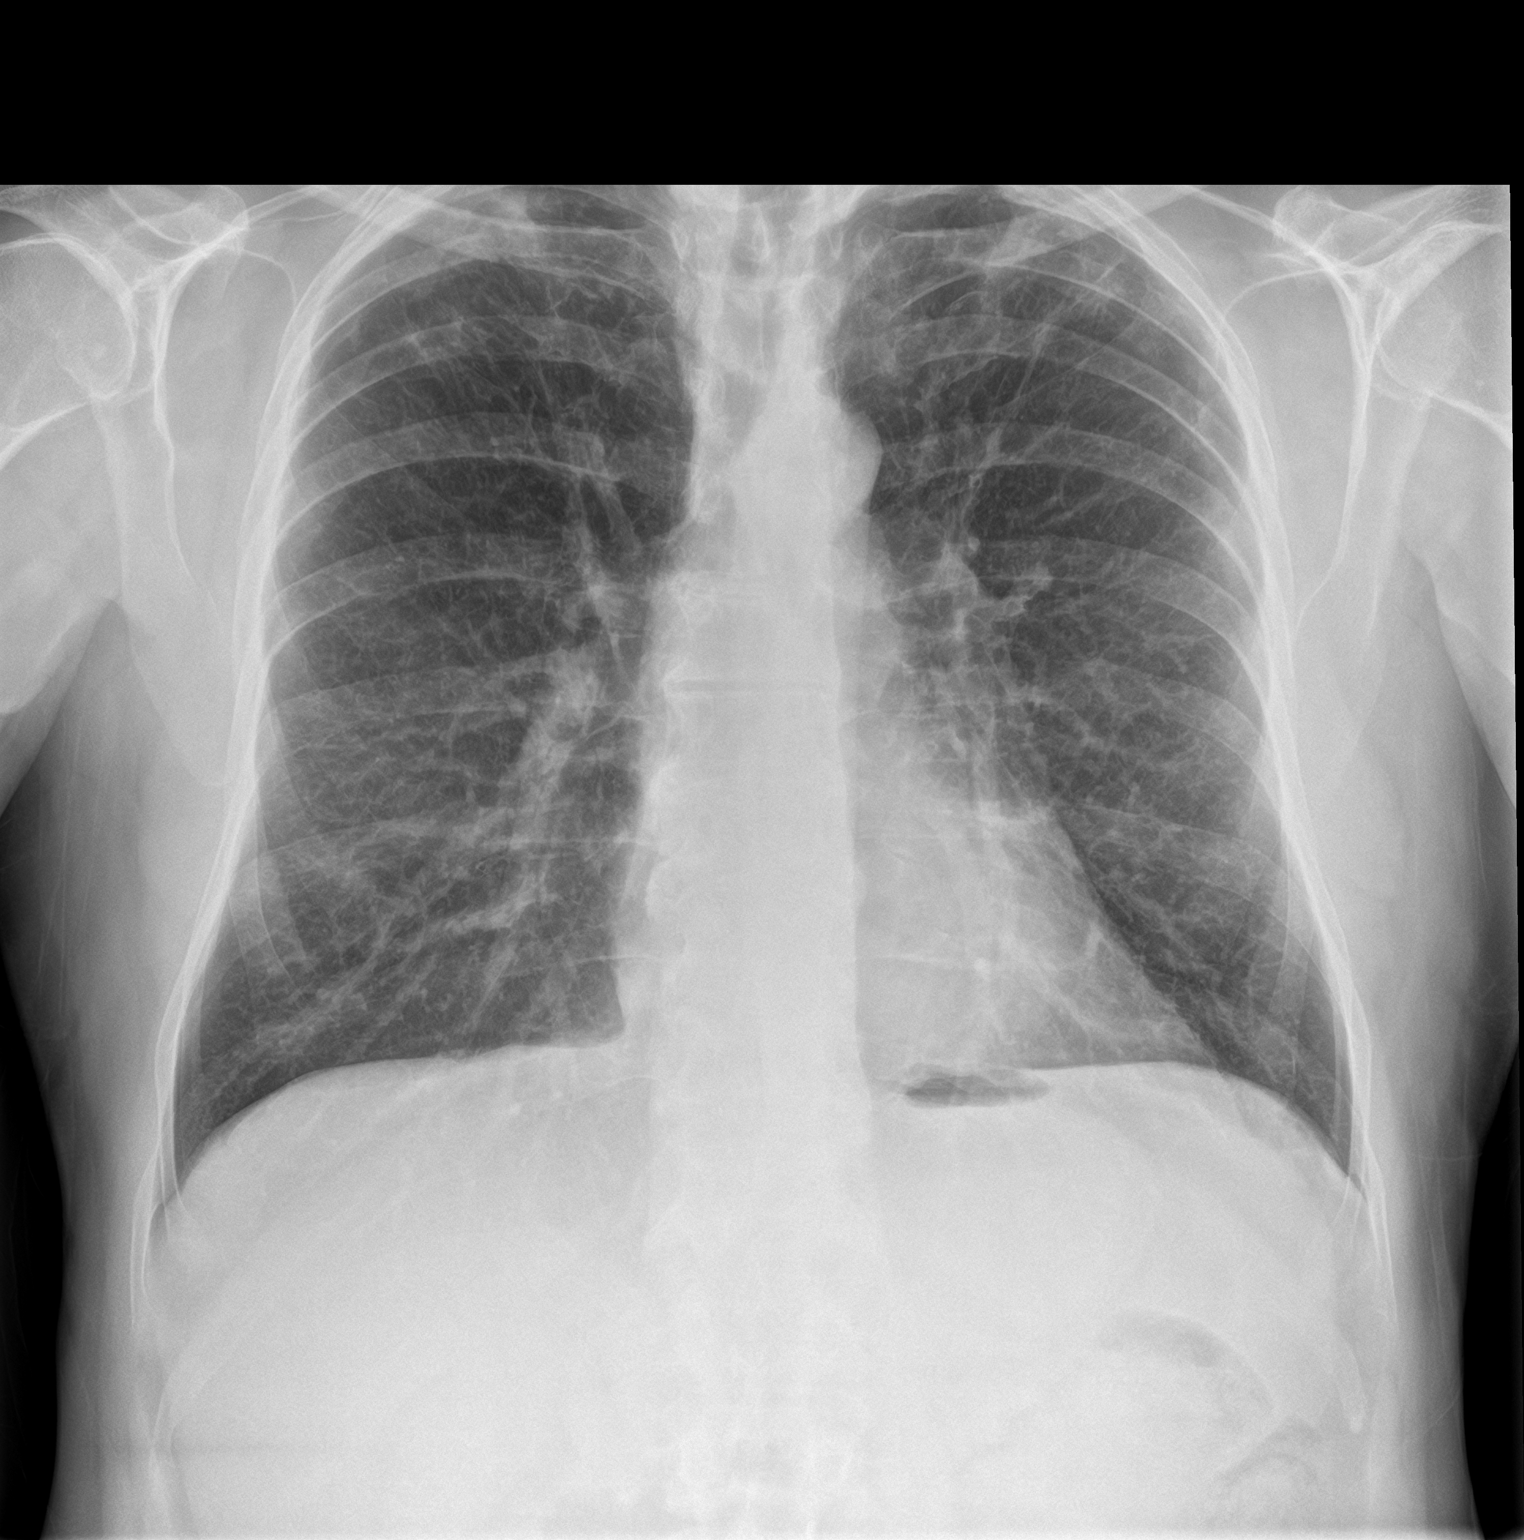

[chest lat]
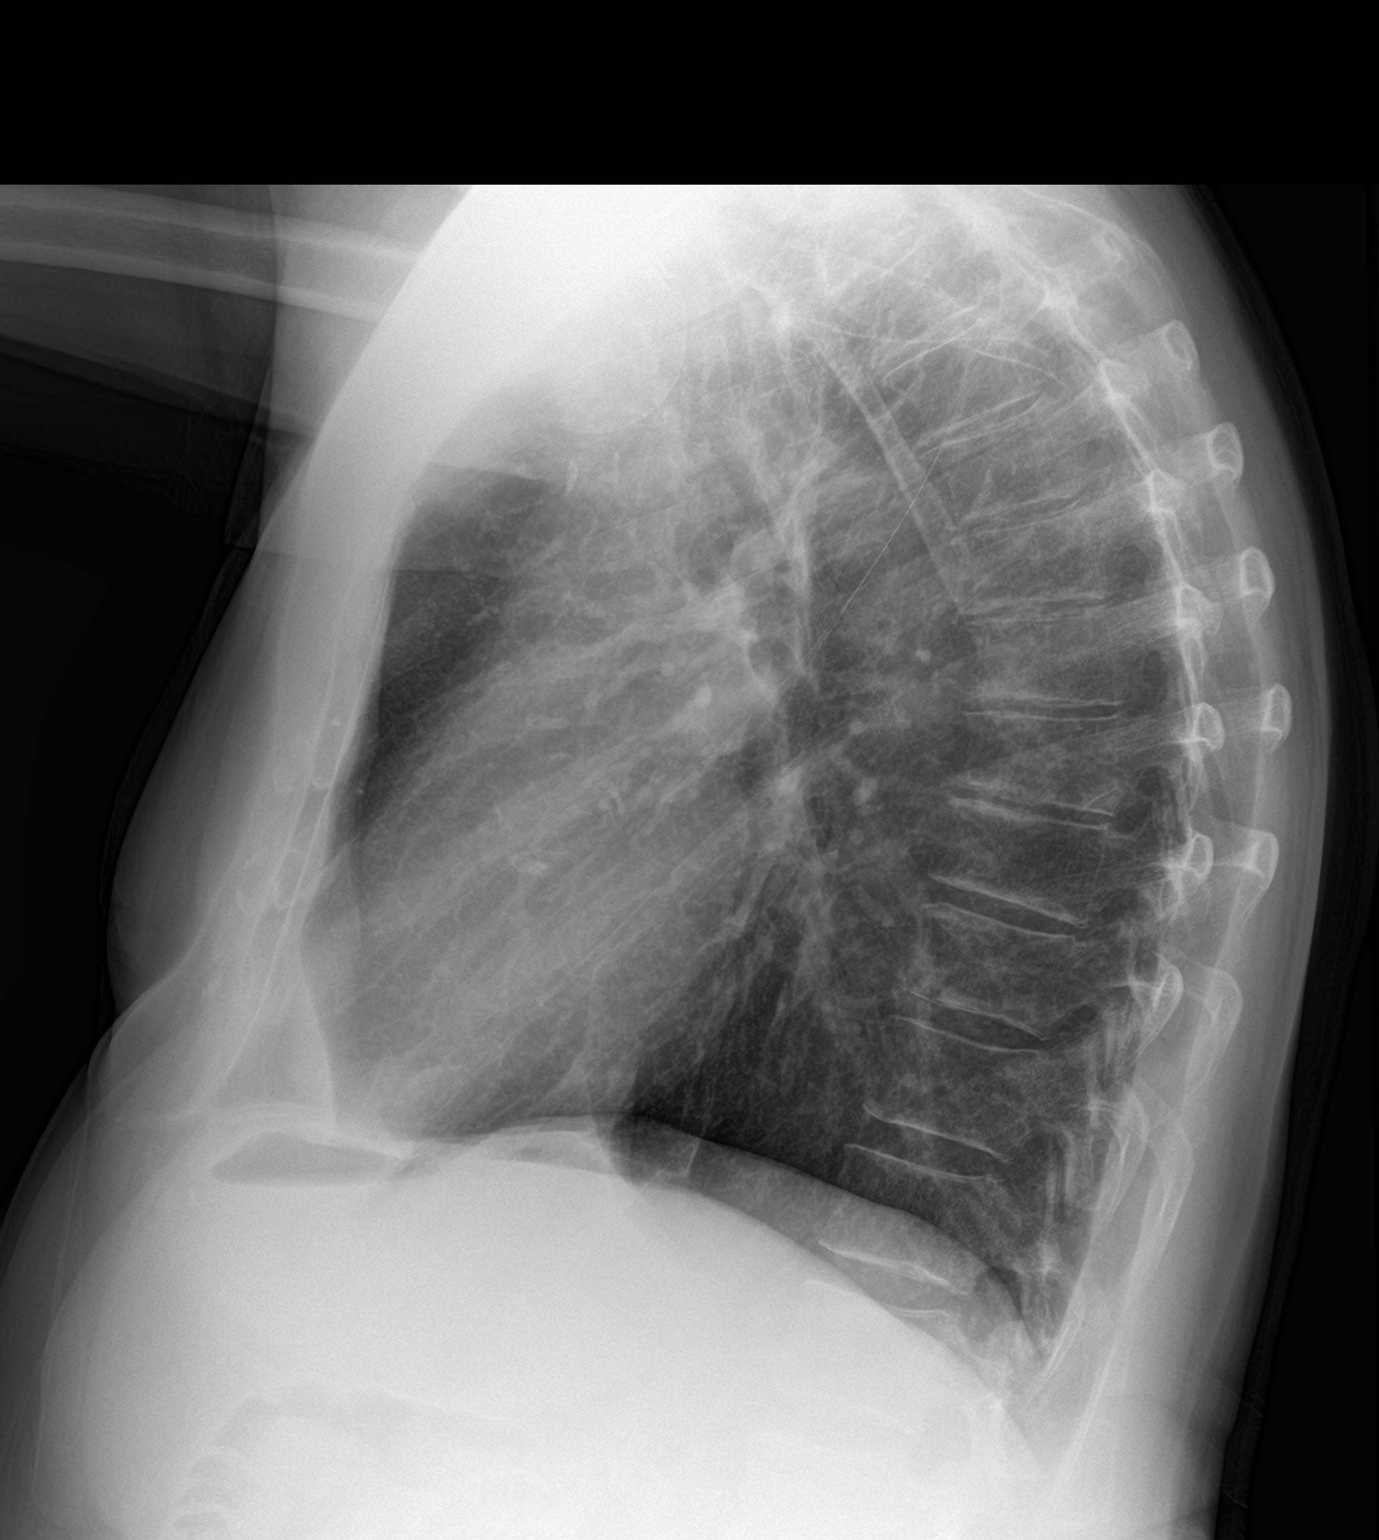

[2 of 2 positions shown; findings below may reference images not displayed]

FINDINGS: Lung volumes are normal. No consolidative airspace disease. No
pleural effusions. No pneumothorax. Extensive pleuroparenchymal
thickening and architectural distortion in the apices of the thorax
bilaterally, similar to the prior study, most compatible with areas
of chronic post infectious or inflammatory scarring. No pulmonary
nodule or mass noted. Pulmonary vasculature and the
cardiomediastinal silhouette are within normal limits.
Atherosclerotic calcifications in the thoracic aorta.
IMPRESSION: 1. Chronic post infectious scarring in the apices of the lungs
bilaterally, similar to the prior study. No radiographic evidence of
acute cardiopulmonary disease.
2. Aortic atherosclerosis.

## 2022-12-16 DIAGNOSIS — J449 Chronic obstructive pulmonary disease, unspecified: Secondary | ICD-10-CM | POA: Diagnosis not present

## 2022-12-20 ENCOUNTER — Encounter: Payer: PPO | Admitting: Internal Medicine

## 2022-12-29 ENCOUNTER — Other Ambulatory Visit: Payer: Self-pay

## 2022-12-29 DIAGNOSIS — J40 Bronchitis, not specified as acute or chronic: Secondary | ICD-10-CM

## 2022-12-29 MED ORDER — ALBUTEROL SULFATE HFA 108 (90 BASE) MCG/ACT IN AERS
2.0000 | INHALATION_SPRAY | Freq: Four times a day (QID) | RESPIRATORY_TRACT | 2 refills | Status: DC | PRN
Start: 2022-12-29 — End: 2023-08-10

## 2023-01-16 DIAGNOSIS — J449 Chronic obstructive pulmonary disease, unspecified: Secondary | ICD-10-CM | POA: Diagnosis not present

## 2023-01-29 ENCOUNTER — Encounter: Payer: Self-pay | Admitting: Internal Medicine

## 2023-02-15 DIAGNOSIS — J449 Chronic obstructive pulmonary disease, unspecified: Secondary | ICD-10-CM | POA: Diagnosis not present

## 2023-03-06 ENCOUNTER — Encounter: Payer: Self-pay | Admitting: Internal Medicine

## 2023-03-06 ENCOUNTER — Ambulatory Visit (INDEPENDENT_AMBULATORY_CARE_PROVIDER_SITE_OTHER): Payer: PPO | Admitting: Internal Medicine

## 2023-03-06 VITALS — BP 134/83 | HR 64 | Ht 69.0 in | Wt 184.4 lb

## 2023-03-06 DIAGNOSIS — K8020 Calculus of gallbladder without cholecystitis without obstruction: Secondary | ICD-10-CM | POA: Diagnosis not present

## 2023-03-06 DIAGNOSIS — E559 Vitamin D deficiency, unspecified: Secondary | ICD-10-CM | POA: Diagnosis not present

## 2023-03-06 DIAGNOSIS — R7303 Prediabetes: Secondary | ICD-10-CM

## 2023-03-06 DIAGNOSIS — J439 Emphysema, unspecified: Secondary | ICD-10-CM

## 2023-03-06 DIAGNOSIS — E782 Mixed hyperlipidemia: Secondary | ICD-10-CM

## 2023-03-06 DIAGNOSIS — Z0001 Encounter for general adult medical examination with abnormal findings: Secondary | ICD-10-CM | POA: Diagnosis not present

## 2023-03-06 DIAGNOSIS — Z125 Encounter for screening for malignant neoplasm of prostate: Secondary | ICD-10-CM | POA: Diagnosis not present

## 2023-03-06 DIAGNOSIS — K760 Fatty (change of) liver, not elsewhere classified: Secondary | ICD-10-CM | POA: Diagnosis not present

## 2023-03-06 DIAGNOSIS — J9611 Chronic respiratory failure with hypoxia: Secondary | ICD-10-CM

## 2023-03-06 NOTE — Assessment & Plan Note (Signed)
Ordered PSA after discussing its limitations for prostate cancer screening, including false positive results leading to additional investigations. 

## 2023-03-06 NOTE — Assessment & Plan Note (Signed)
Has had elevated liver enzymes His alcohol consumption is not significant for alcoholism and he has decreased alcohol intake Advised to follow low-carb diet

## 2023-03-06 NOTE — Assessment & Plan Note (Signed)
Due to COPD Uses O2 at nighttime 

## 2023-03-06 NOTE — Assessment & Plan Note (Signed)
Well-controlled with Breztri now Albuterol as needed Continue Singulair 

## 2023-03-06 NOTE — Patient Instructions (Signed)
Please continue to take medications as prescribed. ? ?Please continue to follow low carb diet and perform moderate exercise/walking at least 150 mins/week. ?

## 2023-03-06 NOTE — Assessment & Plan Note (Signed)
[place] of abdomen showed possible gallstone versus polyp Repeat US RUQ abdomen to rule out gallbladder polyp Currently asymptomatic

## 2023-03-06 NOTE — Progress Notes (Signed)
Established Patient Office Visit  Subjective:  Patient ID: Jonathan Dean, male    DOB: [birth year]  Age: 72 y.o. MRN: [id number]  CC:  Chief Complaint  Patient presents with   Annual Exam    HPI Jonathan Dean is a 72 y.o. male with past medical history of hyperlipidemia, glaucoma and colon polyps who presents for annual physical.  COPD: His breathing has been better since starting [place].  He has not required albuterol inhaler recently.  He denies any chest pain or palpitations.  He denies any fever, chills, sore throat, nasal congestion or postnasal drip.  He continues to take Singulair.  Fatty liver: He was found to have fatty liver disease on [place] of abdomen.  His liver enzymes were elevated in the last CMP.  He reports daily alcohol intake, but only 1 drink per day. He has cut down in the last 6 months and does not take alcohol daily now.  Denies any binge drinking.  Denies any abdominal pain, nausea or vomiting.  6    Past Medical History:  Diagnosis Date   ERECTILE DYSFUNCTION 06/30/2009   Qualifier: Diagnosis of  By: Leveda Anna MD, Kristine Linea    Hyperlipidemia    Polyp of colon, adenomatous 09/15/2011   Colonoscopy 11/07/11 removed at least two adenomatous polyps confirmed by surgical path.      Past Surgical History:  Procedure Laterality Date   COLONOSCOPY N/A 01/20/2021   Procedure: COLONOSCOPY;  Surgeon: Malissa Hippo, MD;  [facility];  Service: Endoscopy;  Laterality: N/A;  am   POLYPECTOMY  01/20/2021   Procedure: POLYPECTOMY;  Surgeon: Malissa Hippo, MD;  [facility];  Service: Endoscopy;;    History reviewed. No pertinent family history.  Social History   Socioeconomic History   Marital status: Married    Spouse name: Not on file   Number of children: Not on file   Years of education: Not on file   Highest education level: Not on file  Occupational History   Not on file  Tobacco Use   Smoking status: Former     Packs/day: 1.50    Years: 25.00    Additional pack years: 0.00    Total pack years: 37.50    Types: Cigarettes    Quit date: 09/14/2005    Years since quitting: 17.4   Smokeless tobacco: Never  Vaping Use   Vaping Use: Never used  Substance and Sexual Activity   Alcohol use: Yes    Alcohol/week: 2.0 standard drinks of alcohol    Types: 2 Standard drinks or equivalent per week   Drug use: No   Sexual activity: Yes    Partners: Female    Comment: monogamous  Other Topics Concern   Not on file  Social History Narrative   Not on file   Social Determinants of Health   Financial Resource Strain: Not on file  Food Insecurity: Not on file  Transportation Needs: No Transportation Needs (11/14/2021)   PRAPARE - Transportation    Lack of Transportation (Medical): No    Lack of Transportation (Non-Medical): No  Physical Activity: Sufficiently Active (11/14/2021)   Exercise Vital Sign    Days of Exercise per Week: 7 days    Minutes of Exercise per Session: 30 min  Stress: Not on file  Social Connections: Not on file  Intimate Partner Violence: Not on file    Outpatient Medications Prior to Visit  Medication Sig Dispense Refill  albuterol (VENTOLIN HFA) 108 (90 Base) MCG/ACT inhaler Inhale 2 puffs into the lungs every 6 (six) hours as needed for wheezing or shortness of breath. 6.7 g 2   Budeson-Glycopyrrol-Formoterol (BREZTRI AEROSPHERE) 160-9-4.8 MCG/ACT AERO Inhale 2 puffs into the lungs 2 (two) times daily. 10.7 g 11   dorzolamide-timolol (COSOPT) 22.3-6.8 MG/ML ophthalmic solution Place 1 drop into both eyes 2 (two) times daily.     guaiFENesin 200 MG tablet Take 1 tablet (200 mg total) by mouth every 4 (four) hours as needed for cough or to loosen phlegm. 30 tablet 2   latanoprost (XALATAN) 0.005 % ophthalmic solution Place 1 drop into both eyes at bedtime.     montelukast (SINGULAIR) 10 MG tablet TAKE 1 TABLET(10 MG) BY MOUTH AT BEDTIME 90 tablet 3   Multiple Vitamins-Minerals  (MULTIVITAMIN WITH MINERALS) tablet Take 1 tablet by mouth as needed.     No facility-administered medications prior to visit.    Allergies  Allergen Reactions   Dorzolamide Hcl-Timolol Mal Other (See Comments)    Swelling of eye lids only the [employer] brand of cosopt drug store is aware of this problem    ROS Review of Systems  Constitutional:  Negative for chills and fever.  HENT:  Negative for congestion and sore throat.   Eyes:  Negative for pain and discharge.  Respiratory:  Negative for cough and shortness of breath.   Cardiovascular:  Negative for chest pain and palpitations.  Gastrointestinal:  Negative for constipation, diarrhea, nausea and vomiting.  Endocrine: Negative for polydipsia and polyuria.  Genitourinary:  Negative for dysuria and hematuria.  Musculoskeletal:  Negative for neck pain and neck stiffness.  Skin:  Negative for rash.  Neurological:  Negative for dizziness, weakness, numbness and headaches.  Psychiatric/Behavioral:  Negative for agitation and behavioral problems.       Objective:    Physical Exam Vitals reviewed.  Constitutional:      General: He is not in acute distress.    Appearance: He is not diaphoretic.  HENT:     Head: Normocephalic and atraumatic.     Nose: Nose normal.     Mouth/Throat:     Mouth: Mucous membranes are moist.  Eyes:     General: No scleral icterus.    Extraocular Movements: Extraocular movements intact.  Cardiovascular:     Rate and Rhythm: Normal rate and regular rhythm.     Pulses: Normal pulses.     Heart sounds: Normal heart sounds. No murmur heard. Pulmonary:     Breath sounds: Normal breath sounds. No wheezing or rales.  Abdominal:     Palpations: Abdomen is soft.     Tenderness: There is no abdominal tenderness.  Musculoskeletal:     Cervical back: Neck supple. No tenderness.     Right lower leg: No edema.     Left lower leg: No edema.  Skin:    General: Skin is warm.     Findings: No rash.   Neurological:     General: No focal deficit present.     Mental Status: He is alert and oriented to person, place, and time.     Cranial Nerves: No cranial nerve deficit.     Sensory: No sensory deficit.     Motor: No weakness.  Psychiatric:        Mood and Affect: Mood normal.        Behavior: Behavior normal.     BP 134/83 (BP Location: Right Arm, Patient Position: Sitting, Cuff  Size: Normal)   Pulse 64   Ht 5\' 9"  (1.753 m)   Wt 184 lb 6.4 oz (83.6 kg)   SpO2 98%   BMI 27.23 kg/m  Wt Readings from Last 3 Encounters:  03/06/23 184 lb 6.4 oz (83.6 kg)  10/17/22 184 lb 9.6 oz (83.7 kg)  06/19/22 184 lb 9.6 oz (83.7 kg)    Lab Results  Component Value Date   TSH 3.300 01/23/2022   Lab Results  Component Value Date   WBC 4.7 01/23/2022   HGB 14.4 01/23/2022   HCT 42.7 01/23/2022   MCV 95 01/23/2022   PLT 246 01/23/2022   Lab Results  Component Value Date   NA 138 10/17/2022   K 4.2 10/17/2022   CO2 25 10/17/2022   GLUCOSE 91 10/17/2022   BUN 11 10/17/2022   CREATININE 1.24 10/17/2022   BILITOT 0.5 10/17/2022   ALKPHOS 76 10/17/2022   AST 75 (H) 10/17/2022   ALT 128 (H) 10/17/2022   PROT 7.0 10/17/2022   ALBUMIN 4.3 10/17/2022   CALCIUM 9.8 10/17/2022   ANIONGAP 11 12/13/2020   EGFR 62 10/17/2022   Lab Results  Component Value Date   CHOL 262 (H) 10/17/2022   Lab Results  Component Value Date   HDL 47 10/17/2022   Lab Results  Component Value Date   LDLCALC 172 (H) 10/17/2022   Lab Results  Component Value Date   TRIG 229 (H) 10/17/2022   Lab Results  Component Value Date   CHOLHDL 5.6 (H) 10/17/2022   Lab Results  Component Value Date   HGBA1C 5.6 01/23/2022      Assessment & Plan:   Problem List Items Addressed This Visit       Respiratory   COPD (chronic obstructive pulmonary disease) (HCC)    Well-controlled with Breztri now Albuterol as needed Continue Singulair      Relevant Orders   TSH   CBC with  Differential/Platelet   Chronic respiratory failure with hypoxia (HCC)    Due to COPD Uses O2 at nighttime      Relevant Orders   CBC with Differential/Platelet     Digestive   Calculus of gallbladder without cholecystitis without obstruction    [place] of abdomen showed possible gallstone versus polyp Repeat US RUQ abdomen to rule out gallbladder polyp Currently asymptomatic      Relevant Orders   US Abdomen Limited RUQ (LIVER/GB)   NAFLD (nonalcoholic fatty liver disease)    Has had elevated liver enzymes His alcohol consumption is not significant for alcoholism and he has decreased alcohol intake Advised to follow low-carb diet      Relevant Orders   TSH   CMP14+EGFR     Other   Prostate cancer screening    Ordered PSA after discussing its limitations for prostate cancer screening, including false positive results leading to additional investigations.      Relevant Orders   PSA   Encounter for general adult medical examination with abnormal findings - Primary    Physical exam as documented. Fasting blood tests today.      Mixed hyperlipidemia    Advised to follow low cholesterol diet for now BP wnl, no h/o DM Plan to start statin if persistently elevated LDL - but need to be cautious due to elevated liver enzymes      Relevant Orders   Lipid panel   Other Visit Diagnoses     Vitamin D deficiency       Relevant Orders  VITAMIN D 25 Hydroxy (Vit-D Deficiency, Fractures)   Prediabetes       Relevant Orders   Hemoglobin A1c       No orders of the defined types were placed in this encounter.   Follow-up: Return in about 6 months (around 09/05/2023) for COPD and fatty liver.    Anabel Halon, MD

## 2023-03-06 NOTE — Assessment & Plan Note (Signed)
Advised to follow low cholesterol diet for now BP wnl, no h/o DM Plan to start statin if persistently elevated LDL - but need to be cautious due to elevated liver enzymes 

## 2023-03-06 NOTE — Assessment & Plan Note (Signed)
Physical exam as documented. Fasting blood tests today. 

## 2023-03-07 LAB — CBC WITH DIFFERENTIAL/PLATELET
Basophils Absolute: 0 10*3/uL (ref 0.0–0.2)
Basos: 1 %
EOS (ABSOLUTE): 0.3 10*3/uL (ref 0.0–0.4)
Eos: 6 %
Hematocrit: 40.8 % (ref 37.5–51.0)
Hemoglobin: 14.2 g/dL (ref 13.0–17.7)
Immature Grans (Abs): 0 10*3/uL (ref 0.0–0.1)
Immature Granulocytes: 0 %
Lymphocytes Absolute: 1.6 10*3/uL (ref 0.7–3.1)
Lymphs: 29 %
MCH: 32.8 pg (ref 26.6–33.0)
MCHC: 34.8 g/dL (ref 31.5–35.7)
MCV: 94 fL (ref 79–97)
Monocytes Absolute: 0.8 10*3/uL (ref 0.1–0.9)
Monocytes: 15 %
Neutrophils Absolute: 2.7 10*3/uL (ref 1.4–7.0)
Neutrophils: 49 %
Platelets: 251 10*3/uL (ref 150–450)
RBC: 4.33 x10E6/uL (ref 4.14–5.80)
RDW: 12.2 % (ref 11.6–15.4)
WBC: 5.4 10*3/uL (ref 3.4–10.8)

## 2023-03-07 LAB — CMP14+EGFR
ALT: 95 IU/L — ABNORMAL HIGH (ref 0–44)
AST: 68 IU/L — ABNORMAL HIGH (ref 0–40)
Albumin/Globulin Ratio: 1.6
Albumin: 4.4 g/dL (ref 3.8–4.8)
Alkaline Phosphatase: 77 IU/L (ref 44–121)
BUN/Creatinine Ratio: 10 (ref 10–24)
BUN: 14 mg/dL (ref 8–27)
Bilirubin Total: 0.7 mg/dL (ref 0.0–1.2)
CO2: 25 mmol/L (ref 20–29)
Calcium: 9.7 mg/dL (ref 8.6–10.2)
Chloride: 100 mmol/L (ref 96–106)
Creatinine, Ser: 1.38 mg/dL — ABNORMAL HIGH (ref 0.76–1.27)
Globulin, Total: 2.8 g/dL (ref 1.5–4.5)
Glucose: 100 mg/dL — ABNORMAL HIGH (ref 70–99)
Potassium: 4.6 mmol/L (ref 3.5–5.2)
Sodium: 139 mmol/L (ref 134–144)
Total Protein: 7.2 g/dL (ref 6.0–8.5)
eGFR: 55 mL/min/{1.73_m2} — ABNORMAL LOW (ref >59)

## 2023-03-07 LAB — LIPID PANEL
Chol/HDL Ratio: 5.5 ratio — ABNORMAL HIGH (ref 0.0–5.0)
Cholesterol, Total: 273 mg/dL — ABNORMAL HIGH (ref 100–199)
HDL: 50 mg/dL (ref >39)
LDL Chol Calc (NIH): 196 mg/dL — ABNORMAL HIGH (ref 0–99)
Triglycerides: 145 mg/dL (ref 0–149)
VLDL Cholesterol Cal: 27 mg/dL (ref 5–40)

## 2023-03-07 LAB — HEMOGLOBIN A1C
Est. average glucose Bld gHb Est-mCnc: 123 mg/dL
Hgb A1c MFr Bld: 5.9 % — ABNORMAL HIGH (ref 4.8–5.6)

## 2023-03-07 LAB — TSH: TSH: 3.64 u[IU]/mL (ref 0.450–4.500)

## 2023-03-07 LAB — VITAMIN D 25 HYDROXY (VIT D DEFICIENCY, FRACTURES): Vit D, 25-Hydroxy: 69.6 ng/mL (ref 30.0–100.0)

## 2023-03-07 LAB — PSA: Prostate Specific Ag, Serum: 3.1 ng/mL (ref 0.0–4.0)

## 2023-03-18 DIAGNOSIS — J449 Chronic obstructive pulmonary disease, unspecified: Secondary | ICD-10-CM | POA: Diagnosis not present

## 2023-03-21 ENCOUNTER — Ambulatory Visit (HOSPITAL_COMMUNITY)
Admission: RE | Admit: 2023-03-21 | Discharge: 2023-03-21 | Disposition: A | Discharge status: Home / Self Care | Payer: PPO | Source: Ambulatory Visit | Attending: Internal Medicine | Admitting: Internal Medicine

## 2023-03-21 DIAGNOSIS — K8020 Calculus of gallbladder without cholecystitis without obstruction: Secondary | ICD-10-CM | POA: Insufficient documentation

## 2023-03-21 DIAGNOSIS — K7689 Other specified diseases of liver: Secondary | ICD-10-CM | POA: Diagnosis not present

## 2023-04-09 DIAGNOSIS — H401113 Primary open-angle glaucoma, right eye, severe stage: Secondary | ICD-10-CM | POA: Diagnosis not present

## 2023-04-09 DIAGNOSIS — H401121 Primary open-angle glaucoma, left eye, mild stage: Secondary | ICD-10-CM | POA: Diagnosis not present

## 2023-04-17 DIAGNOSIS — J449 Chronic obstructive pulmonary disease, unspecified: Secondary | ICD-10-CM | POA: Diagnosis not present

## 2023-04-17 IMAGING — DX DG CHEST 2V
2 series · 2 of 2 positions shown · non-contrast
Comparison: 01/18/2021

CLINICAL DATA: Productive cough for 7 months, former smoker

EXAM:
CHEST - 2 VIEW

[chest pa]
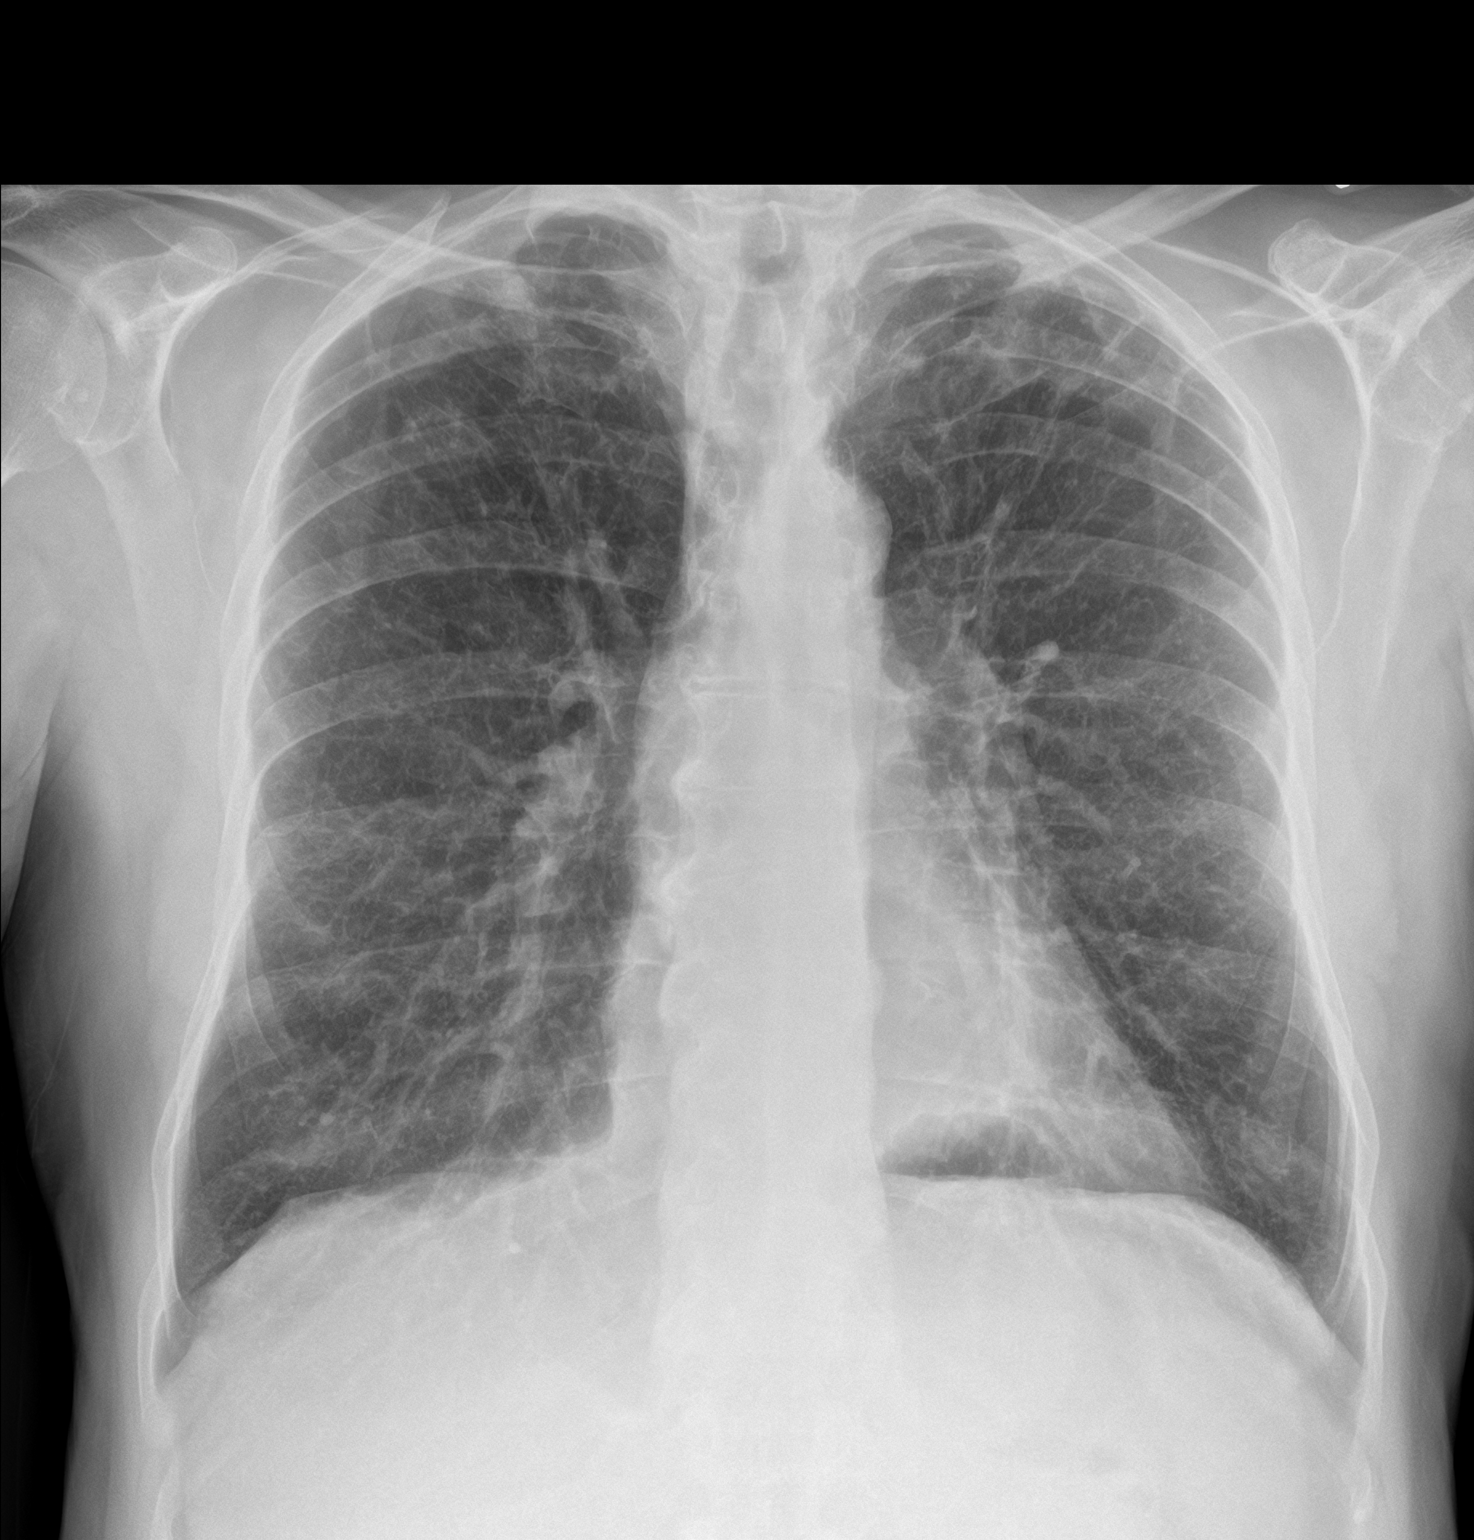

[chest lat]
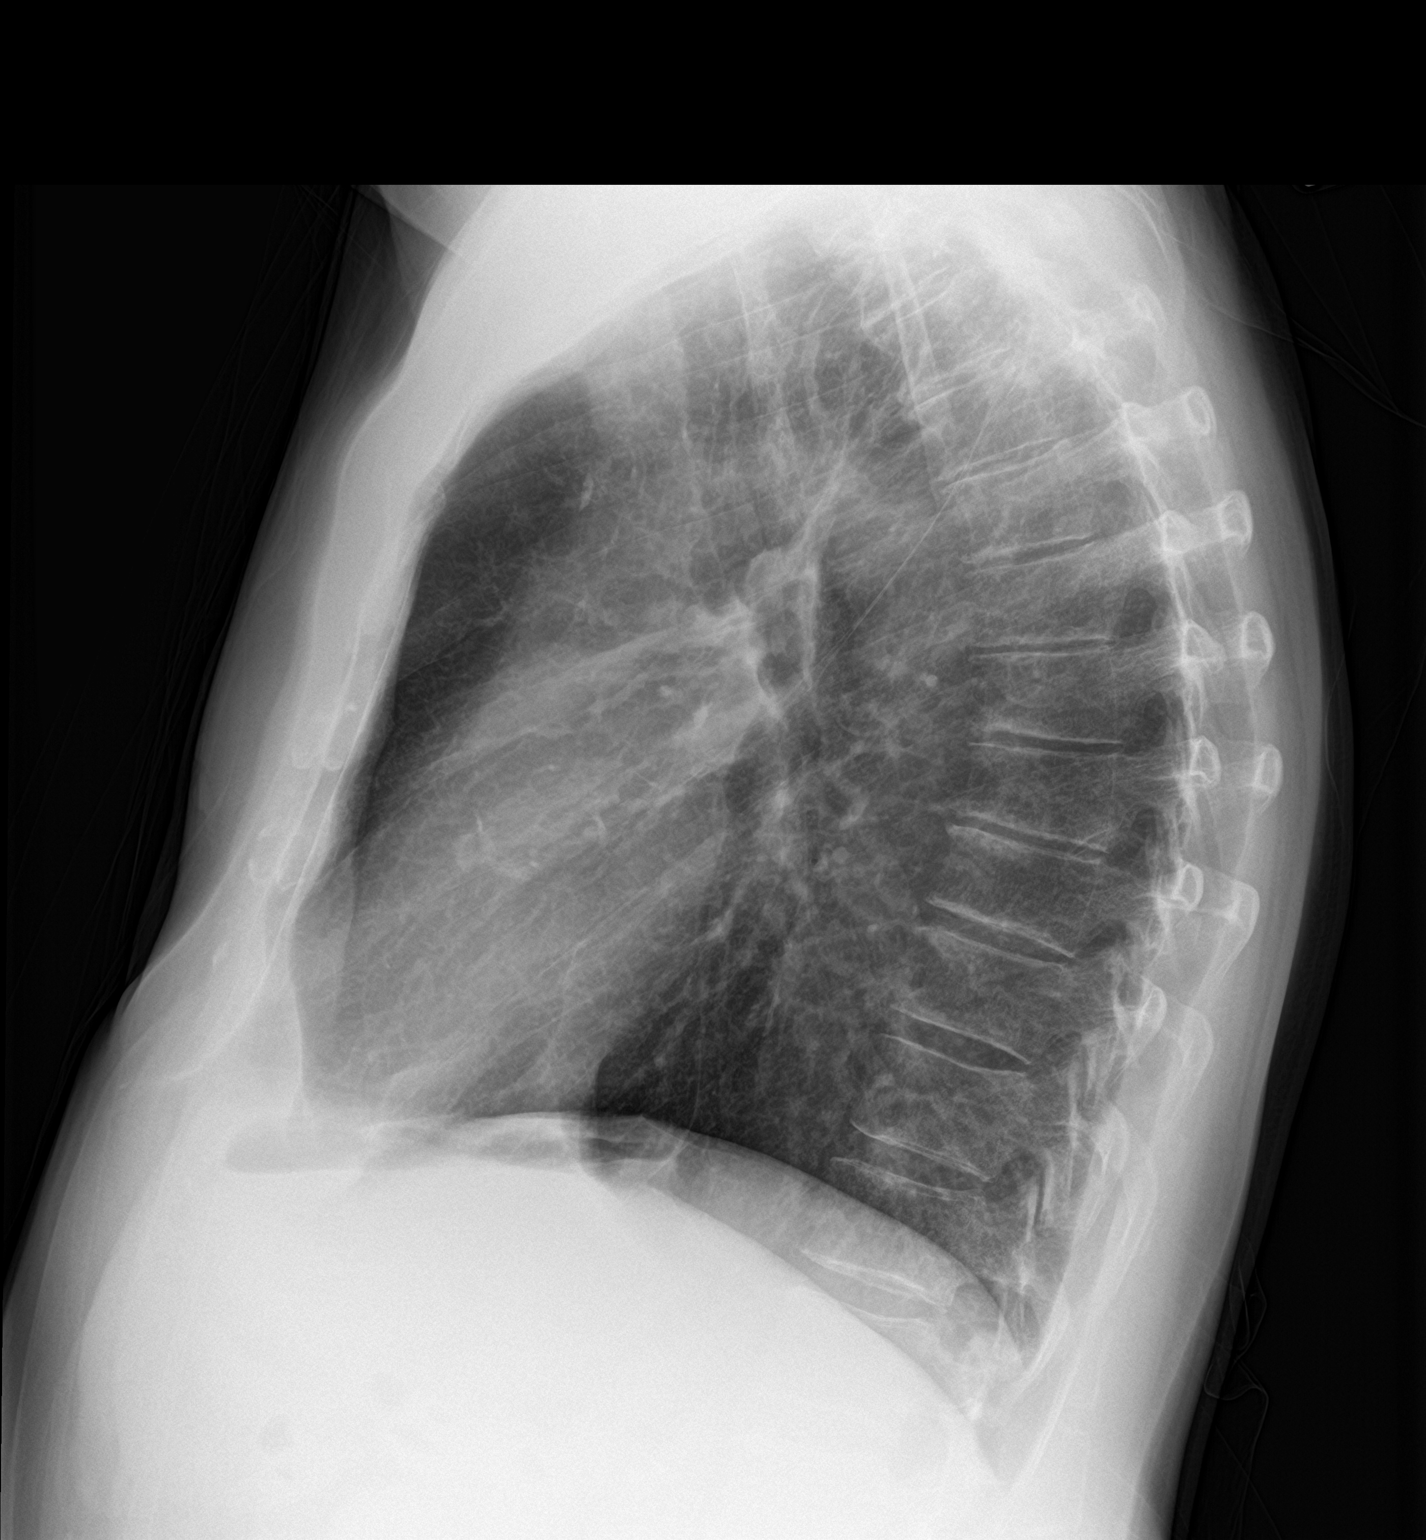

[2 of 2 positions shown; findings below may reference images not displayed]

FINDINGS: Normal heart size, mediastinal contours, and pulmonary vascularity.

Emphysematous and bronchitic changes consistent with COPD.

Biapical scarring unchanged.

No definite infiltrate, pleural effusion, or pneumothorax.

Mild scattered endplate spur formation thoracic spine.
IMPRESSION: COPD changes with biapical scarring.

No acute abnormalities.

## 2023-05-18 DIAGNOSIS — J449 Chronic obstructive pulmonary disease, unspecified: Secondary | ICD-10-CM | POA: Diagnosis not present

## 2023-05-23 ENCOUNTER — Encounter: Payer: Self-pay | Admitting: Internal Medicine

## 2023-05-24 ENCOUNTER — Telehealth: Payer: Self-pay | Admitting: Internal Medicine

## 2023-05-24 NOTE — Telephone Encounter (Signed)
Patient came by office in regard to Budeson-Glycopyrrol-Formoterol [facility] AEROSPHERE) 160-9-4.8 MCG/ACT AERO [[id number]]    States that it is time to renew his assistance program for said medication. Has already spoken to assistance program and they stated that provider just need to call in and verify to keep assistance going. Phone [phone].   Paper is also in providers box in regard.

## 2023-05-25 ENCOUNTER — Other Ambulatory Visit: Payer: Self-pay

## 2023-05-25 DIAGNOSIS — J209 Acute bronchitis, unspecified: Secondary | ICD-10-CM

## 2023-05-25 MED ORDER — BREZTRI AEROSPHERE 160-9-4.8 MCG/ACT IN AERO: 2.0000 | INHALATION_SPRAY | Freq: Two times a day (BID) | RESPIRATORY_TRACT | 3 refills | Status: DC

## 2023-05-25 NOTE — Telephone Encounter (Signed)
No Sir it shouldn't be a issue from here on out.

## 2023-05-25 NOTE — Telephone Encounter (Signed)
They just needed a refill faxed to them. RX faxed to [phone]

## 2023-06-18 DIAGNOSIS — J449 Chronic obstructive pulmonary disease, unspecified: Secondary | ICD-10-CM | POA: Diagnosis not present

## 2023-07-18 DIAGNOSIS — J449 Chronic obstructive pulmonary disease, unspecified: Secondary | ICD-10-CM | POA: Diagnosis not present

## 2023-08-10 ENCOUNTER — Other Ambulatory Visit: Payer: Self-pay | Admitting: Internal Medicine

## 2023-08-10 DIAGNOSIS — J40 Bronchitis, not specified as acute or chronic: Secondary | ICD-10-CM

## 2023-08-13 DIAGNOSIS — H401121 Primary open-angle glaucoma, left eye, mild stage: Secondary | ICD-10-CM | POA: Diagnosis not present

## 2023-08-13 DIAGNOSIS — H401113 Primary open-angle glaucoma, right eye, severe stage: Secondary | ICD-10-CM | POA: Diagnosis not present

## 2023-08-18 DIAGNOSIS — J449 Chronic obstructive pulmonary disease, unspecified: Secondary | ICD-10-CM | POA: Diagnosis not present

## 2023-08-29 ENCOUNTER — Ambulatory Visit (INDEPENDENT_AMBULATORY_CARE_PROVIDER_SITE_OTHER): Payer: PPO | Admitting: Internal Medicine

## 2023-08-29 ENCOUNTER — Encounter: Payer: Self-pay | Admitting: Internal Medicine

## 2023-08-29 VITALS — BP 136/72 | HR 91 | Ht 70.0 in | Wt 200.4 lb

## 2023-08-29 DIAGNOSIS — R7401 Elevation of levels of liver transaminase levels: Secondary | ICD-10-CM

## 2023-08-29 DIAGNOSIS — E782 Mixed hyperlipidemia: Secondary | ICD-10-CM | POA: Diagnosis not present

## 2023-08-29 DIAGNOSIS — R7303 Prediabetes: Secondary | ICD-10-CM | POA: Diagnosis not present

## 2023-08-29 DIAGNOSIS — J9611 Chronic respiratory failure with hypoxia: Secondary | ICD-10-CM

## 2023-08-29 DIAGNOSIS — J439 Emphysema, unspecified: Secondary | ICD-10-CM

## 2023-08-29 NOTE — Assessment & Plan Note (Addendum)
Last CMP showed elevated ALT Likely due to alcoholic beverage, takes about 1-2 drinks almost every day Although his alcohol intake is not significant for alcoholism, considering his elevated liver enzymes, advised to avoid alcohol use US abdomen reviewed Recheck CMP

## 2023-08-29 NOTE — Assessment & Plan Note (Signed)
Well-controlled with Breztri now Albuterol as needed Continue Singulair 

## 2023-08-29 NOTE — Progress Notes (Signed)
Established Patient Office Visit  Subjective:  Patient ID: Jonathan Dean, male    DOB: 1951-06-13  Age: 72 y.o. MRN: [id number]  CC:  Chief Complaint  Patient presents with   COPD    Six month follow up    Elevated Hepatic Enzymes    HPI Jonathan Dean is a 72 y.o. male with past medical history of COPD, hyperlipidemia, glaucoma and colon polyps who presents for f/u of his chronic medical conditions.  COPD: His breathing has been better with Breztri.  He has not required albuterol inhaler recently.  He denies any chest pain or palpitations.  He denies any fever, chills, sore throat, nasal congestion or postnasal drip.  He continues to take Singulair.  Fatty liver: He was found to have fatty liver disease on [place] of abdomen.  His liver enzymes were elevated in the last CMP.  He reports daily alcohol intake, but only 1 drink per day. He has cut down in the last 6 months and does not take alcohol daily now.  Denies any binge drinking.  Denies any abdominal pain, nausea or vomiting.  HLD: His  LDL cholesterol was 192, but was advised to follow low-cholesterol diet.  Due to elevated transaminase, statin was not started.  He admits that he still needs to cut back on fried food.    Past Medical History:  Diagnosis Date   ERECTILE DYSFUNCTION 06/30/2009   Qualifier: Diagnosis of  By: Leveda Anna MD, Kristine Linea    Hyperlipidemia    Polyp of colon, adenomatous 09/15/2011   Colonoscopy 11/07/11 removed at least two adenomatous polyps confirmed by surgical path.      Past Surgical History:  Procedure Laterality Date   COLONOSCOPY N/A 01/20/2021   Procedure: COLONOSCOPY;  Surgeon: Malissa Hippo, MD;  Location: [facility];  Service: Endoscopy;  Laterality: N/A;  am   POLYPECTOMY  01/20/2021   Procedure: POLYPECTOMY;  Surgeon: Malissa Hippo, MD;  Location: [facility];  Service: Endoscopy;;    History reviewed. No pertinent family history.  Social History    Socioeconomic History   Marital status: Married    Spouse name: Not on file   Number of children: Not on file   Years of education: Not on file   Highest education level: Not on file  Occupational History   Not on file  Tobacco Use   Smoking status: Former    Current packs/day: 0.00    Average packs/day: 1.5 packs/day for 25.0 years (37.5 ttl pk-yrs)    Types: Cigarettes    Start date: 09/14/1980    Quit date: 09/14/2005    Years since quitting: 17.9   Smokeless tobacco: Never  Vaping Use   Vaping status: Never Used  Substance and Sexual Activity   Alcohol use: Yes    Alcohol/week: 2.0 standard drinks of alcohol    Types: 2 Standard drinks or equivalent per week   Drug use: No   Sexual activity: Yes    Partners: Female    Comment: monogamous  Other Topics Concern   Not on file  Social History Narrative   Not on file   Social Determinants of Health   Financial Resource Strain: Not on file  Food Insecurity: Not on file  Transportation Needs: No Transportation Needs (11/14/2021)   PRAPARE - Transportation    Lack of Transportation (Medical): No    Lack of Transportation (Non-Medical): No  Physical Activity: Sufficiently Active (11/14/2021)   Exercise Vital  Sign    Days of Exercise per Week: 7 days    Minutes of Exercise per Session: 30 min  Stress: Not on file  Social Connections: Not on file  Intimate Partner Violence: Not on file    Outpatient Medications Prior to Visit  Medication Sig Dispense Refill   albuterol (VENTOLIN HFA) 108 (90 Base) MCG/ACT inhaler Inhale 2 puffs into the lungs every 6 hours as needed for wheezing or shortness of breath. 8.5 g 2   Budeson-Glycopyrrol-Formoterol (BREZTRI AEROSPHERE) 160-9-4.8 MCG/ACT AERO Inhale 2 puffs into the lungs 2 (two) times daily. 3 each 3   dorzolamide-timolol (COSOPT) 22.3-6.8 MG/ML ophthalmic solution Place 1 drop into both eyes 2 (two) times daily.     guaiFENesin 200 MG tablet Take 1 tablet (200 mg total) by  mouth every 4 (four) hours as needed for cough or to loosen phlegm. 30 tablet 2   latanoprost (XALATAN) 0.005 % ophthalmic solution Place 1 drop into both eyes at bedtime.     montelukast (SINGULAIR) 10 MG tablet TAKE 1 TABLET(10 MG) BY MOUTH AT BEDTIME 90 tablet 3   Multiple Vitamins-Minerals (MULTIVITAMIN WITH MINERALS) tablet Take 1 tablet by mouth as needed.     No facility-administered medications prior to visit.    Allergies  Allergen Reactions   Dorzolamide Hcl-Timolol Mal Other (See Comments)    Swelling of eye lids only the [employer] brand of cosopt drug store is aware of this problem    ROS Review of Systems  Constitutional:  Negative for chills and fever.  HENT:  Negative for congestion and sore throat.   Eyes:  Negative for pain and discharge.  Respiratory:  Negative for cough and shortness of breath.   Cardiovascular:  Negative for chest pain and palpitations.  Gastrointestinal:  Negative for constipation, diarrhea, nausea and vomiting.  Endocrine: Negative for polydipsia and polyuria.  Genitourinary:  Negative for dysuria and hematuria.  Musculoskeletal:  Negative for neck pain and neck stiffness.  Skin:  Negative for rash.  Neurological:  Negative for dizziness, weakness, numbness and headaches.  Psychiatric/Behavioral:  Negative for agitation and behavioral problems.       Objective:    Physical Exam Vitals reviewed.  Constitutional:      General: He is not in acute distress.    Appearance: He is not diaphoretic.  HENT:     Head: Normocephalic and atraumatic.     Nose: Nose normal.     Mouth/Throat:     Mouth: Mucous membranes are moist.  Eyes:     General: No scleral icterus.    Extraocular Movements: Extraocular movements intact.  Cardiovascular:     Rate and Rhythm: Normal rate and regular rhythm.     Pulses: Normal pulses.     Heart sounds: Normal heart sounds. No murmur heard. Pulmonary:     Breath sounds: Normal breath sounds. No wheezing or rales.   Abdominal:     Palpations: Abdomen is soft.     Tenderness: There is no abdominal tenderness.  Musculoskeletal:     Cervical back: Neck supple. No tenderness.     Right lower leg: No edema.     Left lower leg: No edema.  Skin:    General: Skin is warm.     Findings: No rash.  Neurological:     General: No focal deficit present.     Mental Status: He is alert and oriented to person, place, and time.     Sensory: No sensory deficit.  Motor: No weakness.  Psychiatric:        Mood and Affect: Mood normal.        Behavior: Behavior normal.     BP 136/72 (BP Location: Left Arm)   Pulse 91   Ht 5\' 10"  (1.778 m)   Wt 200 lb 6.4 oz (90.9 kg)   SpO2 95%   BMI 28.75 kg/m  Wt Readings from Last 3 Encounters:  08/29/23 200 lb 6.4 oz (90.9 kg)  03/06/23 184 lb 6.4 oz (83.6 kg)  10/17/22 184 lb 9.6 oz (83.7 kg)    Lab Results  Component Value Date   TSH 3.640 03/06/2023   Lab Results  Component Value Date   WBC 5.4 03/06/2023   HGB 14.2 03/06/2023   HCT 40.8 03/06/2023   MCV 94 03/06/2023   PLT 251 03/06/2023   Lab Results  Component Value Date   NA 139 03/06/2023   K 4.6 03/06/2023   CO2 25 03/06/2023   GLUCOSE 100 (H) 03/06/2023   BUN 14 03/06/2023   CREATININE 1.38 (H) 03/06/2023   BILITOT 0.7 03/06/2023   ALKPHOS 77 03/06/2023   AST 68 (H) 03/06/2023   ALT 95 (H) 03/06/2023   PROT 7.2 03/06/2023   ALBUMIN 4.4 03/06/2023   CALCIUM 9.7 03/06/2023   ANIONGAP 11 12/13/2020   EGFR 55 (L) 03/06/2023   Lab Results  Component Value Date   CHOL 273 (H) 03/06/2023   Lab Results  Component Value Date   HDL 50 03/06/2023   Lab Results  Component Value Date   LDLCALC 196 (H) 03/06/2023   Lab Results  Component Value Date   TRIG 145 03/06/2023   Lab Results  Component Value Date   CHOLHDL 5.5 (H) 03/06/2023   Lab Results  Component Value Date   HGBA1C 5.9 (H) 03/06/2023      Assessment & Plan:   Problem List Items Addressed This Visit        Respiratory   COPD (chronic obstructive pulmonary disease) (HCC)    Well-controlled with Breztri now Albuterol as needed Continue Singulair      Chronic respiratory failure with hypoxia (HCC)    Due to COPD Uses O2 at nighttime        Other   Mixed hyperlipidemia    Advised to follow low cholesterol diet for now BP wnl, no h/o DM Plan to start statin if persistently elevated LDL - but need to be cautious due to elevated liver enzymes      Relevant Orders   Lipid Profile   Elevated liver transaminase level - Primary    Last CMP showed elevated ALT Likely due to alcoholic beverage, takes about 1-2 drinks almost every day Although his alcohol intake is not significant for alcoholism, considering his elevated liver enzymes, advised to avoid alcohol use US abdomen reviewed Recheck CMP      Relevant Orders   CMP14+EGFR   Prediabetes    Lab Results  Component Value Date   HGBA1C 5.9 (H) 03/06/2023   Advised to follow low-carb diet      Relevant Orders   CMP14+EGFR   Hemoglobin A1c    No orders of the defined types were placed in this encounter.   Follow-up: Return in about 6 months (around 02/27/2024) for Annual physical (after 03/05/24).    Anabel Halon, MD

## 2023-08-29 NOTE — Patient Instructions (Signed)
Please continue to take medications as prescribed.  Please continue to follow low carb diet and perform moderate exercise/walking at least 150 mins/week.  Please get fasting blood tests done within a week.  

## 2023-08-29 NOTE — Assessment & Plan Note (Signed)
Due to COPD Uses O2 at nighttime 

## 2023-08-29 NOTE — Assessment & Plan Note (Signed)
Lab Results  Component Value Date   HGBA1C 5.9 (H) 03/06/2023   Advised to follow low-carb diet

## 2023-08-29 NOTE — Assessment & Plan Note (Signed)
Advised to follow low cholesterol diet for now BP wnl, no h/o DM Plan to start statin if persistently elevated LDL - but need to be cautious due to elevated liver enzymes 

## 2023-08-29 NOTE — Assessment & Plan Note (Deleted)
Has had elevated liver enzymes His current alcohol consumption is not significant for alcoholism and he has decreased alcohol intake Advised to follow low-carb diet US abdomen reviewed

## 2023-08-31 DIAGNOSIS — R7303 Prediabetes: Secondary | ICD-10-CM | POA: Diagnosis not present

## 2023-08-31 DIAGNOSIS — R7401 Elevation of levels of liver transaminase levels: Secondary | ICD-10-CM | POA: Diagnosis not present

## 2023-08-31 DIAGNOSIS — E782 Mixed hyperlipidemia: Secondary | ICD-10-CM | POA: Diagnosis not present

## 2023-09-01 LAB — LIPID PANEL
Chol/HDL Ratio: 6.4 {ratio} — ABNORMAL HIGH (ref 0.0–5.0)
Cholesterol, Total: 283 mg/dL — ABNORMAL HIGH (ref 100–199)
HDL: 44 mg/dL (ref >39)
LDL Chol Calc (NIH): 190 mg/dL — ABNORMAL HIGH (ref 0–99)
Triglycerides: 255 mg/dL — ABNORMAL HIGH (ref 0–149)
VLDL Cholesterol Cal: 49 mg/dL — ABNORMAL HIGH (ref 5–40)

## 2023-09-01 LAB — CMP14+EGFR
ALT: 57 [IU]/L — ABNORMAL HIGH (ref 0–44)
AST: 37 [IU]/L (ref 0–40)
Albumin: 4.4 g/dL (ref 3.8–4.8)
Alkaline Phosphatase: 70 [IU]/L (ref 44–121)
BUN/Creatinine Ratio: 11 (ref 10–24)
BUN: 15 mg/dL (ref 8–27)
Bilirubin Total: 0.7 mg/dL (ref 0.0–1.2)
CO2: 26 mmol/L (ref 20–29)
Calcium: 9.4 mg/dL (ref 8.6–10.2)
Chloride: 101 mmol/L (ref 96–106)
Creatinine, Ser: 1.31 mg/dL — ABNORMAL HIGH (ref 0.76–1.27)
Globulin, Total: 2.4 g/dL (ref 1.5–4.5)
Glucose: 106 mg/dL — ABNORMAL HIGH (ref 70–99)
Potassium: 4.4 mmol/L (ref 3.5–5.2)
Sodium: 140 mmol/L (ref 134–144)
Total Protein: 6.8 g/dL (ref 6.0–8.5)
eGFR: 58 mL/min/{1.73_m2} — ABNORMAL LOW (ref >59)

## 2023-09-01 LAB — HEMOGLOBIN A1C
Est. average glucose Bld gHb Est-mCnc: 120 mg/dL
Hgb A1c MFr Bld: 5.8 % — ABNORMAL HIGH (ref 4.8–5.6)

## 2023-09-05 ENCOUNTER — Ambulatory Visit: Payer: PPO | Admitting: Internal Medicine

## 2023-09-09 ENCOUNTER — Other Ambulatory Visit: Payer: Self-pay | Admitting: Internal Medicine

## 2023-09-09 DIAGNOSIS — R7401 Elevation of levels of liver transaminase levels: Secondary | ICD-10-CM

## 2023-09-09 DIAGNOSIS — E782 Mixed hyperlipidemia: Secondary | ICD-10-CM

## 2023-09-09 MED ORDER — ROSUVASTATIN CALCIUM 10 MG PO TABS
10.0000 mg | ORAL_TABLET | Freq: Every evening | ORAL | 1 refills | Status: DC
Start: 2023-09-09 — End: 2024-03-03

## 2023-09-17 DIAGNOSIS — J449 Chronic obstructive pulmonary disease, unspecified: Secondary | ICD-10-CM | POA: Diagnosis not present

## 2023-10-18 DIAGNOSIS — J449 Chronic obstructive pulmonary disease, unspecified: Secondary | ICD-10-CM | POA: Diagnosis not present

## 2023-10-19 ENCOUNTER — Other Ambulatory Visit: Payer: Self-pay | Admitting: Internal Medicine

## 2023-10-19 DIAGNOSIS — R0609 Other forms of dyspnea: Secondary | ICD-10-CM

## 2023-11-18 DIAGNOSIS — J449 Chronic obstructive pulmonary disease, unspecified: Secondary | ICD-10-CM | POA: Diagnosis not present

## 2023-11-26 ENCOUNTER — Encounter: Payer: Self-pay | Admitting: Internal Medicine

## 2023-12-03 ENCOUNTER — Encounter

## 2023-12-03 ENCOUNTER — Other Ambulatory Visit: Payer: Self-pay | Admitting: Internal Medicine

## 2023-12-03 ENCOUNTER — Telehealth: Payer: Self-pay

## 2023-12-03 DIAGNOSIS — J209 Acute bronchitis, unspecified: Secondary | ICD-10-CM

## 2023-12-03 MED ORDER — BREZTRI AEROSPHERE 160-9-4.8 MCG/ACT IN AERO: 2.0000 | INHALATION_SPRAY | Freq: Two times a day (BID) | RESPIRATORY_TRACT | 3 refills | Status: DC

## 2023-12-03 NOTE — Telephone Encounter (Signed)
 Received a fax from [facility] they need a new prescription for Briztri from Dr office  can fax it to [facility] before they can mail his prescription.

## 2023-12-03 NOTE — Telephone Encounter (Signed)
 Request was sent to Dr Dondra Prader to have office send a new Rx to [facility].

## 2023-12-16 DIAGNOSIS — J449 Chronic obstructive pulmonary disease, unspecified: Secondary | ICD-10-CM | POA: Diagnosis not present

## 2023-12-17 ENCOUNTER — Other Ambulatory Visit: Payer: Self-pay | Admitting: Internal Medicine

## 2023-12-17 DIAGNOSIS — J40 Bronchitis, not specified as acute or chronic: Secondary | ICD-10-CM

## 2024-01-05 ENCOUNTER — Encounter: Payer: Self-pay | Admitting: Internal Medicine

## 2024-01-07 ENCOUNTER — Telehealth: Payer: Self-pay | Admitting: Internal Medicine

## 2024-01-07 NOTE — Telephone Encounter (Signed)
 Spoke with pt wife- explained patient does not have missed appts.

## 2024-01-11 ENCOUNTER — Encounter: Payer: Self-pay | Admitting: Internal Medicine

## 2024-01-16 DIAGNOSIS — J449 Chronic obstructive pulmonary disease, unspecified: Secondary | ICD-10-CM | POA: Diagnosis not present

## 2024-01-29 ENCOUNTER — Ambulatory Visit (INDEPENDENT_AMBULATORY_CARE_PROVIDER_SITE_OTHER)

## 2024-01-29 VITALS — Ht 70.0 in | Wt 194.0 lb

## 2024-01-29 DIAGNOSIS — Z0000 Encounter for general adult medical examination without abnormal findings: Secondary | ICD-10-CM | POA: Diagnosis not present

## 2024-01-29 NOTE — Patient Instructions (Signed)
 Jonathan Dean , Thank you for taking time to come for your Medicare Wellness Visit. I appreciate your ongoing commitment to your health goals. Please review the following plan we discussed and let me know if I can assist you in the future.   Referrals/Orders/Follow-Ups/Clinician Recommendations:    Next Medicare AWV: Feb 02, 2025 at 8:00 am video visit      Aim for 30 minutes of exercise or brisk walking, 6-8 glasses of water , and 5 servings of fruits and vegetables each day.   This is a list of the screening recommended for you and due dates:  Health Maintenance  Topic Date Due   COVID-19 Vaccine (6 - 2024-25 season) 05/27/2023   Flu Shot  04/25/2024   Medicare Annual Wellness Visit  01/28/2025   Colon Cancer Screening  01/20/2026   DTaP/Tdap/Td vaccine (4 - Td or Tdap) 06/25/2032   Hepatitis C Screening  Completed   Zoster (Shingles) Vaccine  Completed   HPV Vaccine  Aged Out   Meningitis B Vaccine  Aged Out   Pneumonia Vaccine  Discontinued    Advanced directives: (Declined) Advance directive discussed with you today. Even though you declined this today, please call our office should you change your mind, and we can give you the proper paperwork for you to fill out. Advance Care Planning is important because it:  [x]  Makes sure you receive the medical care that is consistent with your values, goals, and preferences  [x]  It provides guidance to your family and loved ones and it also reduces their decisional burden about whether or not they are making the right decisions based on what you want done  Follow the link provided in your after visit summary or read over the paperwork we have mailed to you to help you started getting your Advance Directives in place. If you need assistance in completing these, please reach out to us  so that we can help you!  Next Medicare Annual Wellness Visit scheduled for next year: yes  Understanding Your Risk for Falls Millions of people have serious  injuries from falls each year. It is important to understand your risk of falling. Talk with your health care provider about your risk and what you can do to lower it. If you do have a serious fall, make sure to tell your provider. Falling once raises your risk of falling again. How can falls affect me? Serious injuries from falls are common. These include: Broken bones, such as hip fractures. Head injuries, such as traumatic brain injuries (TBI) or concussions. A fear of falling can cause you to avoid activities and stay at home. This can make your muscles weaker and raise your risk for a fall. What can increase my risk? There are a number of risk factors that increase your risk for falling. The more risk factors you have, the higher your risk of falling. Serious injuries from a fall happen most often to people who are older than 73 years old. Teenagers and young adults ages 5-29 are also at higher risk. Common risk factors include: Weakness in the lower body. Being generally weak or confused due to long-term (chronic) illness. Dizziness or balance problems. Poor vision. Medicines that cause dizziness or drowsiness. These may include: Medicines for your blood pressure, heart, anxiety, insomnia, or swelling (edema). Pain medicines. Muscle relaxants. Other risk factors include: Drinking alcohol. Having had a fall in the past. Having foot pain or wearing improper footwear. Working at a dangerous job. Having any of the following  in your home: Tripping hazards, such as floor clutter or loose rugs. Poor lighting. Pets. Having dementia or memory loss. What actions can I take to lower my risk of falling?  Physical activity Stay physically fit. Do strength and balance exercises. Consider taking a regular class to build strength and balance. Yoga and tai chi are good options. Vision Have your eyes checked every year and your prescription for glasses or contacts updated as needed. Shoes and  walking aids Wear non-skid shoes. Wear shoes that have rubber soles and low heels. Do not wear high heels. Do not walk around the house in socks or slippers. Use a cane or walker as told by your provider. Home safety Attach secure railings on both sides of your stairs. Install grab bars for your bathtub, shower, and toilet. Use a non-skid mat in your bathtub or shower. Attach bath mats securely with double-sided, non-slip rug tape. Use good lighting in all rooms. Keep a flashlight near your bed. Make sure there is a clear path from your bed to the bathroom. Use night-lights. Do not use throw rugs. Make sure all carpeting is taped or tacked down securely. Remove all clutter from walkways and stairways, including extension cords. Repair uneven or broken steps and floors. Avoid walking on icy or slippery surfaces. Walk on the grass instead of on icy or slick sidewalks. Use ice melter to get rid of ice on walkways in the winter. Use a cordless phone. Questions to ask your health care provider Can you help me check my risk for a fall? Do any of my medicines make me more likely to fall? Should I take a vitamin D  supplement? What exercises can I do to improve my strength and balance? Should I make an appointment to have my vision checked? Do I need a bone density test to check for weak bones (osteoporosis)? Would it help to use a cane or a walker? Where to find more information Centers for Disease Control and Prevention, [employer]: TonerPromos.no Community-Based Fall Prevention Programs: TonerPromos.no [employer] on Aging: BaseRingTones.pl Contact a health care provider if: You fall at home. You are afraid of falling at home. You feel weak, drowsy, or dizzy. This information is not intended to replace advice given to you by your health care provider. Make sure you discuss any questions you have with your health care provider. Document Revised: 05/15/2022 Document Reviewed: 05/15/2022 Elsevier Patient  Education  2024 [employer].

## 2024-01-29 NOTE — Progress Notes (Signed)
 Subjective:   Jonathan Dean is a 73 y.o. who presents for a Medicare Wellness preventive visit.  Visit Complete: Virtual I connected with  ANABEL HOLLERBACH on 01/29/24 by a audio enabled telemedicine application and verified that I am speaking with the correct person using two identifiers.  Patient Location: Home  Provider Location: Home Office  I discussed the limitations of evaluation and management by telemedicine. The patient expressed understanding and agreed to proceed.  Vital Signs: Because this visit was a virtual/telehealth visit, some criteria may be missing or patient reported. Any vitals not documented were not able to be obtained and vitals that have been documented are patient reported.  VideoDeclined- This patient declined Librarian, academic. Therefore the visit was completed with audio only.  Persons Participating in Visit: Patient.  AWV Questionnaire: No: Patient Medicare AWV questionnaire was not completed prior to this visit.  Cardiac Risk Factors include: advanced age (>81men, >62 women);dyslipidemia;male gender     Objective:    Today's Vitals   01/29/24 0803  Weight: 194 lb (88 kg)  Height: 5\' 10"  (1.778 m)   Body mass index is 27.84 kg/m.     01/29/2024    8:06 AM 11/14/2021    8:51 AM 01/20/2021    8:38 AM  Advanced Directives  Does Patient Have a Medical Advance Directive? No No No  Would patient like information on creating a medical advance directive? No - Patient declined Yes (ED - Information included in AVS) No - Patient declined    Current Medications (verified) Outpatient Encounter Medications as of 01/29/2024  Medication Sig   albuterol  (VENTOLIN  HFA) 108 (90 Base) MCG/ACT inhaler Inhale 2 puffs into the lungs every 6 hours as needed for wheezing or shortness of breath.   Budeson-Glycopyrrol-Formoterol (BREZTRI  AEROSPHERE) 160-9-4.8 MCG/ACT AERO Inhale 2 puffs into the lungs 2 (two) times daily.    dorzolamide-timolol (COSOPT) 22.3-6.8 MG/ML ophthalmic solution Place 1 drop into both eyes 2 (two) times daily.   guaiFENesin  200 MG tablet Take 1 tablet (200 mg total) by mouth every 4 (four) hours as needed for cough or to loosen phlegm.   latanoprost (XALATAN) 0.005 % ophthalmic solution Place 1 drop into both eyes at bedtime.   montelukast  (SINGULAIR ) 10 MG tablet TAKE (1) TABLET BY MOUTH AT BEDTIME.   Multiple Vitamins-Minerals (MULTIVITAMIN WITH MINERALS) tablet Take 1 tablet by mouth as needed.   rosuvastatin  (CRESTOR ) 10 MG tablet Take 1 tablet (10 mg total) by mouth every evening.   No facility-administered encounter medications on file as of 01/29/2024.    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     Past Surgical History:  Procedure Laterality Date   COLONOSCOPY N/A 01/20/2021   Procedure: COLONOSCOPY;  Surgeon: Ruby Corporal, MD;  [facility];  Service: Endoscopy;  Laterality: N/A;  am   POLYPECTOMY  01/20/2021   Procedure: POLYPECTOMY;  Surgeon: Ruby Corporal, MD;  Location: [facility];  Service: Endoscopy;;   History reviewed. No pertinent family history. Social History   Socioeconomic History   Marital status: Married    Spouse name: Not on file   Number of children: Not on file  Years of education: Not on file   Highest education level: 12th grade  Occupational History   Not on file  Tobacco Use   Smoking status: Former    Current packs/day: 0.00    Average packs/day: 1.5 packs/day for 25.0 years (37.5 ttl pk-yrs)    Types: Cigarettes    Start date: 09/14/1980    Quit date: 09/14/2005    Years since quitting: 18.3   Smokeless tobacco:  Never  Vaping Use   Vaping status: Never Used  Substance and Sexual Activity   Alcohol use: Yes    Alcohol/week: 2.0 standard drinks of alcohol    Types: 2 Standard drinks or equivalent per week   Drug use: No   Sexual activity: Yes    Partners: Female    Comment: monogamous  Other Topics Concern   Not on file  Social History Narrative   Not on file   Social Drivers of Health   Financial Resource Strain: Low Risk  (01/29/2024)   Overall Financial Resource Strain (CARDIA)    Difficulty of Paying Living Expenses: Not hard at all  Recent Concern: Financial Resource Strain - Medium Risk (11/26/2023)   Overall Financial Resource Strain (CARDIA)    Difficulty of Paying Living Expenses: Somewhat hard  Food Insecurity: No Food Insecurity (01/29/2024)   Hunger Vital Sign    Worried About Running Out of Food in the Last Year: Never true    Ran Out of Food in the Last Year: Never true  Transportation Needs: No Transportation Needs (01/29/2024)   PRAPARE - Administrator, Civil Service (Medical): No    Lack of Transportation (Non-Medical): No  Physical Activity: Sufficiently Active (01/29/2024)   Exercise Vital Sign    Days of Exercise per Week: 7 days    Minutes of Exercise per Session: 30 min  Stress: No Stress Concern Present (01/29/2024)   [employer] of Occupational Health - Occupational Stress Questionnaire    Feeling of Stress : Not at all  Social Connections: Moderately Integrated (01/29/2024)   Social Connection and Isolation Panel [NHANES]    Frequency of Communication with Friends and Family: More than three times a week    Frequency of Social Gatherings with Friends and Family: More than three times a week    Attends Religious Services: More than 4 times per year    Active Member of [employer] or Organizations: No    Attends Banker Meetings: Never    Marital Status: Married  Recent Concern: Social Connections - Moderately Isolated (11/26/2023)   Social  Connection and Isolation Panel [NHANES]    Frequency of Communication with Friends and Family: Twice a week    Frequency of Social Gatherings with Friends and Family: More than three times a week    Attends Religious Services: Never    Database administrator or Organizations: No    Attends Engineer, structural: Not on file    Marital Status: Married    Tobacco Counseling Counseling given: Yes    Clinical Intake:  Pre-visit preparation completed: Yes  Pain : No/denies pain     BMI - recorded: 27.84 Nutritional Status: BMI 25 -29 Overweight Nutritional Risks: None Diabetes: No  Lab Results  Component Value Date   HGBA1C 5.8 (H) 08/31/2023   HGBA1C 5.9 (H) 03/06/2023   HGBA1C 5.6 01/23/2022     How often do you need to have someone help you when you read instructions, pamphlets, or other written materials from your  doctor or pharmacy?: 1 - Never  Interpreter Needed?: No  Information entered by :: Sally Crazier CMA   Activities of Daily Living     01/29/2024    8:05 AM 11/26/2023   10:15 AM  In your present state of health, do you have any difficulty performing the following activities:  Hearing? 0 0  Vision? 0 1  Difficulty concentrating or making decisions? 0 0  Walking or climbing stairs? 0 0  Dressing or bathing? 0 0  Doing errands, shopping? 0 0  Preparing Food and eating ? N N  Using the Toilet? N N  In the past six months, have you accidently leaked urine? N N  Do you have problems with loss of bowel control? N N  Managing your Medications? N N  Managing your Finances? N N  Housekeeping or managing your Housekeeping? N N    Patient Care Team: Meldon Sport, MD as PCP - General (Internal Medicine)  Indicate any recent Medical Services you may have received from other than [facility] providers in the past year (date may be approximate).     Assessment:   This is a routine wellness examination for Jaffet.  Hearing/Vision screen Hearing Screening -  Comments:: Patient denies any hearing difficulties.   Vision Screening - Comments:: Wears rx glasses - up to date with routine eye exams  Patient sees Edgar Goods w/ [facility] in [place]     Goals Addressed             This Visit's Progress    Patient Stated       I have a boat I'd like to finish getting together and go fishing.        Depression Screen     01/29/2024    8:10 AM 08/29/2023    1:18 PM 03/06/2023    8:08 AM 11/28/2022    1:07 PM 10/17/2022    8:50 AM 06/19/2022    8:42 AM 12/15/2021    1:08 PM  PHQ 2/9 Scores  PHQ - 2 Score 0 0 0 0 0 0 0  PHQ- 9 Score 0     0     Fall Risk     01/29/2024    8:09 AM 11/26/2023   10:15 AM 08/29/2023    1:18 PM 03/06/2023    8:08 AM 11/28/2022    1:04 PM  Fall Risk   Falls in the past year? 0 0 0 0 0  Number falls in past yr: 0 0 0 0 0  Injury with Fall? 0 0 0 0 0  Risk for fall due to : No Fall Risks  No Fall Risks    Follow up Falls prevention discussed;Falls evaluation completed;Education provided  Falls evaluation completed      MEDICARE RISK AT HOME:  Medicare Risk at Home Any stairs in or around the home?: Yes If so, are there any without handrails?: No Home free of loose throw rugs in walkways, pet beds, electrical cords, etc?: Yes Adequate lighting in your home to reduce risk of falls?: Yes Life alert?: No Use of a cane, walker or w/c?: No Grab bars in the bathroom?: Yes Shower chair or bench in shower?: Yes Elevated toilet seat or a handicapped toilet?: No  TIMED UP AND GO:  Was the test performed?  No  Cognitive Function: 6CIT completed        01/29/2024    8:10 AM 11/28/2022    1:07 PM 11/14/2021    8:53  AM  6CIT Screen  What Year? 0 points 0 points 0 points  What month? 0 points 0 points 0 points  What time? 0 points 0 points 0 points  Count back from 20 0 points 0 points 0 points  Months in reverse 0 points 0 points 0 points  Repeat phrase 0 points 2 points 0 points  Total Score 0 points 2 points  0 points    Immunizations Immunization History  Administered Date(s) Administered   Fluad Quad(high Dose 65+) 06/25/2020, 06/16/2021, 05/23/2023   Influenza Split 10/04/2012   Influenza Whole 06/30/2009   Influenza, Quadrivalent, Recombinant, Inj, Pf 06/08/2022   Influenza,inj,Quad PF,6+ Mos 08/01/2014   Influenza-Unspecified 05/23/2023   PFIZER(Purple Top)SARS-COV-2 Vaccination 12/18/2019, 01/10/2020, 11/03/2020   Pfizer Covid-19 Vaccine Bivalent Booster 70yrs & up 08/03/2021   Pneumococcal Conjugate-13 07/26/2020   Td 09/25/1997, 06/29/2008   Tdap 06/25/2022   Unspecified SARS-COV-2 Vaccination 06/25/2022   Zoster Recombinant(Shingrix) 06/30/2022, 11/14/2022    Screening Tests Health Maintenance  Topic Date Due   COVID-19 Vaccine (6 - 2024-25 season) 05/27/2023   INFLUENZA VACCINE  04/25/2024   Medicare Annual Wellness (AWV)  01/28/2025   Colonoscopy  01/20/2026   DTaP/Tdap/Td (4 - Td or Tdap) 06/25/2032   Hepatitis C Screening  Completed   Zoster Vaccines- Shingrix  Completed   HPV VACCINES  Aged Out   Meningococcal B Vaccine  Aged Out   Pneumonia Vaccine 48+ Years old  Discontinued    Health Maintenance  Health Maintenance Due  Topic Date Due   COVID-19 Vaccine (6 - 2024-25 season) 05/27/2023   Health Maintenance Items Addressed: Health Maintenance is up to date.   Additional Screening:  Vision Screening: Recommended annual ophthalmology exams for early detection of glaucoma and other disorders of the eye. Patient sees Dr. Edgar Goods in [place] with [facility] Up to date on exams.  Dental Screening: Recommended annual dental exams for proper oral hygiene  Community Resource Referral / Chronic Care Management: CRR required this visit?  No   CCM required this visit?  No     Plan:     I have personally reviewed and noted the following in the patient's chart:   Medical and social history Use of alcohol, tobacco or illicit drugs  Current  medications and supplements including opioid prescriptions. Patient is not currently taking opioid prescriptions. Functional ability and status Nutritional status Physical activity Advanced directives List of other physicians Hospitalizations, surgeries, and ER visits in previous 12 months Vitals Screenings to include cognitive, depression, and falls Referrals and appointments  In addition, I have reviewed and discussed with patient certain preventive protocols, quality metrics, and best practice recommendations. A written personalized care plan for preventive services as well as general preventive health recommendations were provided to patient.      Leory Allinson, CMA   01/29/2024   After Visit Summary: (MyChart) Due to this being a telephonic visit, the after visit summary with patients personalized plan was offered to patient via MyChart   Notes: Nothing significant to report at this time.

## 2024-02-04 DIAGNOSIS — H401121 Primary open-angle glaucoma, left eye, mild stage: Secondary | ICD-10-CM | POA: Diagnosis not present

## 2024-02-04 DIAGNOSIS — H401113 Primary open-angle glaucoma, right eye, severe stage: Secondary | ICD-10-CM | POA: Diagnosis not present

## 2024-03-01 ENCOUNTER — Other Ambulatory Visit: Payer: Self-pay | Admitting: Internal Medicine

## 2024-03-01 DIAGNOSIS — E782 Mixed hyperlipidemia: Secondary | ICD-10-CM

## 2024-03-06 ENCOUNTER — Encounter: Payer: PPO | Admitting: Internal Medicine

## 2024-03-11 ENCOUNTER — Encounter: Payer: Self-pay | Admitting: Internal Medicine

## 2024-03-12 ENCOUNTER — Encounter: Payer: Self-pay | Admitting: Internal Medicine

## 2024-03-12 ENCOUNTER — Other Ambulatory Visit: Payer: Self-pay | Admitting: Internal Medicine

## 2024-03-12 DIAGNOSIS — E559 Vitamin D deficiency, unspecified: Secondary | ICD-10-CM

## 2024-03-12 DIAGNOSIS — J439 Emphysema, unspecified: Secondary | ICD-10-CM

## 2024-03-12 DIAGNOSIS — R7303 Prediabetes: Secondary | ICD-10-CM

## 2024-03-12 DIAGNOSIS — Z125 Encounter for screening for malignant neoplasm of prostate: Secondary | ICD-10-CM

## 2024-03-12 DIAGNOSIS — E782 Mixed hyperlipidemia: Secondary | ICD-10-CM

## 2024-03-12 DIAGNOSIS — K760 Fatty (change of) liver, not elsewhere classified: Secondary | ICD-10-CM

## 2024-03-13 NOTE — Telephone Encounter (Signed)
 scheduled

## 2024-03-14 DIAGNOSIS — Z125 Encounter for screening for malignant neoplasm of prostate: Secondary | ICD-10-CM | POA: Diagnosis not present

## 2024-03-14 DIAGNOSIS — E559 Vitamin D deficiency, unspecified: Secondary | ICD-10-CM | POA: Diagnosis not present

## 2024-03-14 DIAGNOSIS — K760 Fatty (change of) liver, not elsewhere classified: Secondary | ICD-10-CM | POA: Diagnosis not present

## 2024-03-14 DIAGNOSIS — R7303 Prediabetes: Secondary | ICD-10-CM | POA: Diagnosis not present

## 2024-03-14 DIAGNOSIS — J439 Emphysema, unspecified: Secondary | ICD-10-CM | POA: Diagnosis not present

## 2024-03-14 DIAGNOSIS — E782 Mixed hyperlipidemia: Secondary | ICD-10-CM | POA: Diagnosis not present

## 2024-03-15 LAB — LIPID PANEL
Chol/HDL Ratio: 4.4 ratio (ref 0.0–5.0)
Cholesterol, Total: 185 mg/dL (ref 100–199)
HDL: 42 mg/dL (ref >39)
LDL Chol Calc (NIH): 109 mg/dL — ABNORMAL HIGH (ref 0–99)
Triglycerides: 192 mg/dL — ABNORMAL HIGH (ref 0–149)
VLDL Cholesterol Cal: 34 mg/dL (ref 5–40)

## 2024-03-15 LAB — CMP14+EGFR
ALT: 26 IU/L (ref 0–44)
AST: 26 IU/L (ref 0–40)
Albumin: 4.4 g/dL (ref 3.8–4.8)
Alkaline Phosphatase: 77 IU/L (ref 44–121)
BUN/Creatinine Ratio: 11 (ref 10–24)
BUN: 14 mg/dL (ref 8–27)
Bilirubin Total: 0.6 mg/dL (ref 0.0–1.2)
CO2: 23 mmol/L (ref 20–29)
Calcium: 9.4 mg/dL (ref 8.6–10.2)
Chloride: 99 mmol/L (ref 96–106)
Creatinine, Ser: 1.28 mg/dL — ABNORMAL HIGH (ref 0.76–1.27)
Globulin, Total: 2.4 g/dL (ref 1.5–4.5)
Glucose: 106 mg/dL — ABNORMAL HIGH (ref 70–99)
Potassium: 4.3 mmol/L (ref 3.5–5.2)
Sodium: 137 mmol/L (ref 134–144)
Total Protein: 6.8 g/dL (ref 6.0–8.5)
eGFR: 59 mL/min/{1.73_m2} — ABNORMAL LOW (ref >59)

## 2024-03-15 LAB — CBC WITH DIFFERENTIAL/PLATELET
Basophils Absolute: 0 10*3/uL (ref 0.0–0.2)
Basos: 1 %
EOS (ABSOLUTE): 0.2 10*3/uL (ref 0.0–0.4)
Eos: 5 %
Hematocrit: 41.8 % (ref 37.5–51.0)
Hemoglobin: 13.6 g/dL (ref 13.0–17.7)
Immature Grans (Abs): 0 10*3/uL (ref 0.0–0.1)
Immature Granulocytes: 0 %
Lymphocytes Absolute: 1.5 10*3/uL (ref 0.7–3.1)
Lymphs: 33 %
MCH: 31.9 pg (ref 26.6–33.0)
MCHC: 32.5 g/dL (ref 31.5–35.7)
MCV: 98 fL — ABNORMAL HIGH (ref 79–97)
Monocytes Absolute: 0.6 10*3/uL (ref 0.1–0.9)
Monocytes: 14 %
Neutrophils Absolute: 2.2 10*3/uL (ref 1.4–7.0)
Neutrophils: 47 %
Platelets: 216 10*3/uL (ref 150–450)
RBC: 4.26 x10E6/uL (ref 4.14–5.80)
RDW: 12.7 % (ref 11.6–15.4)
WBC: 4.6 10*3/uL (ref 3.4–10.8)

## 2024-03-15 LAB — HEMOGLOBIN A1C
Est. average glucose Bld gHb Est-mCnc: 120 mg/dL
Hgb A1c MFr Bld: 5.8 % — ABNORMAL HIGH (ref 4.8–5.6)

## 2024-03-15 LAB — VITAMIN D 25 HYDROXY (VIT D DEFICIENCY, FRACTURES): Vit D, 25-Hydroxy: 58.1 ng/mL (ref 30.0–100.0)

## 2024-03-15 LAB — PSA: Prostate Specific Ag, Serum: 2.8 ng/mL (ref 0.0–4.0)

## 2024-03-15 LAB — TSH: TSH: 3.4 u[IU]/mL (ref 0.450–4.500)

## 2024-03-17 ENCOUNTER — Encounter: Payer: Self-pay | Admitting: Internal Medicine

## 2024-03-17 ENCOUNTER — Ambulatory Visit (INDEPENDENT_AMBULATORY_CARE_PROVIDER_SITE_OTHER): Admitting: Internal Medicine

## 2024-03-17 VITALS — BP 132/67 | HR 60 | Ht 70.0 in | Wt 194.8 lb

## 2024-03-17 DIAGNOSIS — Z0001 Encounter for general adult medical examination with abnormal findings: Secondary | ICD-10-CM

## 2024-03-17 DIAGNOSIS — R7303 Prediabetes: Secondary | ICD-10-CM

## 2024-03-17 DIAGNOSIS — K760 Fatty (change of) liver, not elsewhere classified: Secondary | ICD-10-CM

## 2024-03-17 DIAGNOSIS — E782 Mixed hyperlipidemia: Secondary | ICD-10-CM

## 2024-03-17 DIAGNOSIS — J439 Emphysema, unspecified: Secondary | ICD-10-CM | POA: Diagnosis not present

## 2024-03-17 DIAGNOSIS — J9611 Chronic respiratory failure with hypoxia: Secondary | ICD-10-CM

## 2024-03-17 NOTE — Patient Instructions (Signed)
Please continue to take medications as prescribed.  Please continue to follow low carb diet and perform moderate exercise/walking at least 150 mins/week.  Please get fasting blood tests done before the next visit. 

## 2024-03-17 NOTE — Assessment & Plan Note (Signed)
Physical exam as documented. Fasting blood tests reviewed today.

## 2024-03-17 NOTE — Assessment & Plan Note (Signed)
 Lab Results  Component Value Date   HGBA1C 5.8 (H) 03/14/2024   Advised to follow low-carb diet

## 2024-03-17 NOTE — Progress Notes (Signed)
 Established Patient Office Visit  Subjective:  Patient ID: Jonathan Dean, male    DOB: 07/15/1951  Age: 73 y.o. MRN: [id number]  CC:  Chief Complaint  Patient presents with   Annual Exam    HPI Jonathan Dean is a 73 y.o. male with past medical history of COPD, hyperlipidemia, glaucoma and colon polyps who presents for annual physical.  COPD: His breathing has been better with Breztri .  He has not required albuterol  inhaler recently.  He denies any chest pain or palpitations.  He denies any fever, chills, sore throat, nasal congestion or postnasal drip.  He continues to take Singulair .  Fatty liver: He was found to have fatty liver disease on US  of abdomen.  His liver enzymes were elevated in the last CMP.  He reports daily alcohol intake, but only 1 drink per day. He has cut down and does not take alcohol daily now.  Denies any binge drinking.  Denies any abdominal pain, nausea or vomiting.  HLD: His  LDL cholesterol has improved to 109 from 190 in 12/24. He is tolerating Crestor  5 mg well. He has tried to cut back on fried food.    Past Medical History:  Diagnosis Date   ERECTILE DYSFUNCTION 06/30/2009   Qualifier: Diagnosis of  By: Scarlet MD, Elsie Peels    Hyperlipidemia    Polyp of colon, adenomatous 09/15/2011   Colonoscopy 11/07/11 removed at least two adenomatous polyps confirmed by surgical path.      Past Surgical History:  Procedure Laterality Date   COLONOSCOPY N/A 01/20/2021   Procedure: COLONOSCOPY;  Surgeon: Golda Claudis PENNER, MD;  [facility];  Service: Endoscopy;  Laterality: N/A;  am   POLYPECTOMY  01/20/2021   Procedure: POLYPECTOMY;  Surgeon: Golda Claudis PENNER, MD;  [facility];  Service: Endoscopy;;    History reviewed. No pertinent family history.  Social History   Socioeconomic History   Marital status: Married    Spouse name: Not on file   Number of children: Not on file   Years of education: Not on file    Highest education level: 12th grade  Occupational History   Not on file  Tobacco Use   Smoking status: Former    Current packs/day: 0.00    Average packs/day: 1.5 packs/day for 25.0 years (37.5 ttl pk-yrs)    Types: Cigarettes    Start date: 09/14/1980    Quit date: 09/14/2005    Years since quitting: 18.5   Smokeless tobacco: Never  Vaping Use   Vaping status: Never Used  Substance and Sexual Activity   Alcohol use: Yes    Alcohol/week: 2.0 standard drinks of alcohol    Types: 2 Standard drinks or equivalent per week   Drug use: No   Sexual activity: Yes    Partners: Female    Comment: monogamous  Other Topics Concern   Not on file  Social History Narrative   Not on file   Social Drivers of Health   Financial Resource Strain: Low Risk  (01/29/2024)   Overall Financial Resource Strain (CARDIA)    Difficulty of Paying Living Expenses: Not hard at all  Recent Concern: Financial Resource Strain - Medium Risk (11/26/2023)   Overall Financial Resource Strain (CARDIA)    Difficulty of Paying Living Expenses: Somewhat hard  Food Insecurity: No Food Insecurity (01/29/2024)   Hunger Vital Sign    Worried About Running Out of Food in the Last Year: Never  true    Ran Out of Food in the Last Year: Never true  Transportation Needs: No Transportation Needs (01/29/2024)   PRAPARE - Administrator, Civil Service (Medical): No    Lack of Transportation (Non-Medical): No  Physical Activity: Sufficiently Active (01/29/2024)   Exercise Vital Sign    Days of Exercise per Week: 7 days    Minutes of Exercise per Session: 30 min  Stress: No Stress Concern Present (01/29/2024)   [employer] of Occupational Health - Occupational Stress Questionnaire    Feeling of Stress : Not at all  Social Connections: Moderately Integrated (01/29/2024)   Social Connection and Isolation Panel    Frequency of Communication with Friends and Family: More than three times a week    Frequency of Social  Gatherings with Friends and Family: More than three times a week    Attends Religious Services: More than 4 times per year    Active Member of [employer] or Organizations: No    Attends Banker Meetings: Never    Marital Status: Married  Recent Concern: Social Connections - Moderately Isolated (11/26/2023)   Social Connection and Isolation Panel    Frequency of Communication with Friends and Family: Twice a week    Frequency of Social Gatherings with Friends and Family: More than three times a week    Attends Religious Services: Never    Database administrator or Organizations: No    Attends Engineer, structural: Not on file    Marital Status: Married  Catering manager Violence: Not At Risk (01/29/2024)   Humiliation, Afraid, Rape, and Kick questionnaire    Fear of Current or Ex-Partner: No    Emotionally Abused: No    Physically Abused: No    Sexually Abused: No    Outpatient Medications Prior to Visit  Medication Sig Dispense Refill   albuterol  (VENTOLIN  HFA) 108 (90 Base) MCG/ACT inhaler Inhale 2 puffs into the lungs every 6 hours as needed for wheezing or shortness of breath. 8.5 g 2   Budeson-Glycopyrrol-Formoterol (BREZTRI  AEROSPHERE) 160-9-4.8 MCG/ACT AERO Inhale 2 puffs into the lungs 2 (two) times daily. 3 each 3   dorzolamide-timolol (COSOPT) 22.3-6.8 MG/ML ophthalmic solution Place 1 drop into both eyes 2 (two) times daily.     guaiFENesin  200 MG tablet Take 1 tablet (200 mg total) by mouth every 4 (four) hours as needed for cough or to loosen phlegm. 30 tablet 2   latanoprost (XALATAN) 0.005 % ophthalmic solution Place 1 drop into both eyes at bedtime.     montelukast  (SINGULAIR ) 10 MG tablet TAKE (1) TABLET BY MOUTH AT BEDTIME. 90 tablet 3   Multiple Vitamins-Minerals (MULTIVITAMIN WITH MINERALS) tablet Take 1 tablet by mouth as needed.     rosuvastatin  (CRESTOR ) 10 MG tablet Take 1 tablet (10 mg total) by mouth every evening. 90 tablet 1   No  facility-administered medications prior to visit.    Allergies  Allergen Reactions   Dorzolamide Hcl-Timolol Mal Other (See Comments)    Swelling of eye lids only the [employer] brand of cosopt drug store is aware of this problem    ROS Review of Systems  Constitutional:  Negative for chills and fever.  HENT:  Negative for congestion and sore throat.   Eyes:  Negative for pain and discharge.  Respiratory:  Negative for cough and shortness of breath.   Cardiovascular:  Negative for chest pain and palpitations.  Gastrointestinal:  Negative for constipation, diarrhea, nausea  and vomiting.  Endocrine: Negative for polydipsia and polyuria.  Genitourinary:  Negative for dysuria and hematuria.  Musculoskeletal:  Negative for neck pain and neck stiffness.  Skin:  Negative for rash.  Neurological:  Negative for dizziness, weakness, numbness and headaches.  Psychiatric/Behavioral:  Negative for agitation and behavioral problems.       Objective:    Physical Exam Vitals reviewed.  Constitutional:      General: He is not in acute distress.    Appearance: He is not diaphoretic.  HENT:     Head: Normocephalic and atraumatic.     Nose: Nose normal.     Mouth/Throat:     Mouth: Mucous membranes are moist.   Eyes:     General: No scleral icterus.    Extraocular Movements: Extraocular movements intact.    Cardiovascular:     Rate and Rhythm: Normal rate and regular rhythm.     Pulses: Normal pulses.     Heart sounds: Normal heart sounds. No murmur heard. Pulmonary:     Breath sounds: Normal breath sounds. No wheezing or rales.  Abdominal:     Palpations: Abdomen is soft.     Tenderness: There is no abdominal tenderness.   Musculoskeletal:     Cervical back: Neck supple. No tenderness.     Right lower leg: No edema.     Left lower leg: No edema.   Skin:    General: Skin is warm.     Findings: No rash.   Neurological:     General: No focal deficit present.     Mental Status:  He is alert and oriented to person, place, and time.     Sensory: No sensory deficit.     Motor: No weakness.   Psychiatric:        Mood and Affect: Mood normal.        Behavior: Behavior normal.     BP 132/67   Pulse 60   Ht 5' 10 (1.778 m)   Wt 194 lb 12.8 oz (88.4 kg)   SpO2 96%   BMI 27.95 kg/m  Wt Readings from Last 3 Encounters:  03/17/24 194 lb 12.8 oz (88.4 kg)  01/29/24 194 lb (88 kg)  08/29/23 200 lb 6.4 oz (90.9 kg)    Lab Results  Component Value Date   TSH 3.400 03/14/2024   Lab Results  Component Value Date   WBC 4.6 03/14/2024   HGB 13.6 03/14/2024   HCT 41.8 03/14/2024   MCV 98 (H) 03/14/2024   PLT 216 03/14/2024   Lab Results  Component Value Date   NA 137 03/14/2024   K 4.3 03/14/2024   CO2 23 03/14/2024   GLUCOSE 106 (H) 03/14/2024   BUN 14 03/14/2024   CREATININE 1.28 (H) 03/14/2024   BILITOT 0.6 03/14/2024   ALKPHOS 77 03/14/2024   AST 26 03/14/2024   ALT 26 03/14/2024   PROT 6.8 03/14/2024   ALBUMIN 4.4 03/14/2024   CALCIUM  9.4 03/14/2024   ANIONGAP 11 12/13/2020   EGFR 59 (L) 03/14/2024   Lab Results  Component Value Date   CHOL 185 03/14/2024   Lab Results  Component Value Date   HDL 42 03/14/2024   Lab Results  Component Value Date   LDLCALC 109 (H) 03/14/2024   Lab Results  Component Value Date   TRIG 192 (H) 03/14/2024   Lab Results  Component Value Date   CHOLHDL 4.4 03/14/2024   Lab Results  Component Value Date   HGBA1C  5.8 (H) 03/14/2024      Assessment & Plan:   Problem List Items Addressed This Visit       Respiratory   COPD (chronic obstructive pulmonary disease) (HCC)   Well-controlled with Breztri  now Albuterol  as needed Continue Singulair       Chronic respiratory failure with hypoxia (HCC)   Due to COPD Uses O2 at nighttime        Digestive   NAFLD (nonalcoholic fatty liver disease)   Has had elevated liver enzymes in the past, now better His alcohol consumption is not significant  currently and he has decreased alcohol intake Advised to follow low-carb diet      Relevant Orders   CMP14+EGFR     Other   Encounter for general adult medical examination with abnormal findings - Primary   Physical exam as documented. Fasting blood tests reviewed today.      Mixed hyperlipidemia   On Crestor  5 mg QD Lipid profile shows significant improvement in LDL Advised to follow low cholesterol diet for now BP wnl, no h/o DM      Relevant Orders   Lipid Profile   Prediabetes   Lab Results  Component Value Date   HGBA1C 5.8 (H) 03/14/2024   Advised to follow low-carb diet       No orders of the defined types were placed in this encounter.   Follow-up: Return in about 6 months (around 09/16/2024) for COPD and HLD.    Suzzane MARLA Blanch, MD

## 2024-03-17 NOTE — Assessment & Plan Note (Signed)
Due to COPD Uses O2 at nighttime 

## 2024-03-17 NOTE — Assessment & Plan Note (Signed)
 Has had elevated liver enzymes in the past, now better His alcohol consumption is not significant currently and he has decreased alcohol intake Advised to follow low-carb diet

## 2024-03-17 NOTE — Assessment & Plan Note (Signed)
 On Crestor  5 mg QD Lipid profile shows significant improvement in LDL Advised to follow low cholesterol diet for now BP wnl, no h/o DM

## 2024-03-17 NOTE — Assessment & Plan Note (Signed)
Well-controlled with Breztri now Albuterol as needed Continue Singulair 

## 2024-07-07 DIAGNOSIS — H401121 Primary open-angle glaucoma, left eye, mild stage: Secondary | ICD-10-CM | POA: Diagnosis not present

## 2024-07-07 DIAGNOSIS — H401113 Primary open-angle glaucoma, right eye, severe stage: Secondary | ICD-10-CM | POA: Diagnosis not present

## 2024-07-11 ENCOUNTER — Encounter: Payer: Self-pay | Admitting: Internal Medicine

## 2024-07-24 ENCOUNTER — Other Ambulatory Visit: Payer: Self-pay | Admitting: Internal Medicine

## 2024-07-24 DIAGNOSIS — J209 Acute bronchitis, unspecified: Secondary | ICD-10-CM

## 2024-07-24 MED ORDER — BREZTRI AEROSPHERE 160-9-4.8 MCG/ACT IN AERO: 2.0000 | INHALATION_SPRAY | Freq: Two times a day (BID) | RESPIRATORY_TRACT | 3 refills | Status: AC

## 2024-07-29 ENCOUNTER — Other Ambulatory Visit: Payer: Self-pay | Admitting: Internal Medicine

## 2024-07-29 DIAGNOSIS — J40 Bronchitis, not specified as acute or chronic: Secondary | ICD-10-CM

## 2024-08-07 ENCOUNTER — Telehealth: Payer: Self-pay

## 2024-08-11 DIAGNOSIS — H524 Presbyopia: Secondary | ICD-10-CM | POA: Diagnosis not present

## 2024-08-11 DIAGNOSIS — H52223 Regular astigmatism, bilateral: Secondary | ICD-10-CM | POA: Diagnosis not present

## 2024-09-11 LAB — LIPID PANEL
Chol/HDL Ratio: 5 ratio (ref 0.0–5.0)
Cholesterol, Total: 211 mg/dL — ABNORMAL HIGH (ref 100–199)
HDL: 42 mg/dL (ref >39)
LDL Chol Calc (NIH): 127 mg/dL — ABNORMAL HIGH (ref 0–99)
Triglycerides: 234 mg/dL — ABNORMAL HIGH (ref 0–149)
VLDL Cholesterol Cal: 42 mg/dL — ABNORMAL HIGH (ref 5–40)

## 2024-09-11 LAB — CMP14+EGFR
ALT: 31 IU/L (ref 0–44)
AST: 29 IU/L (ref 0–40)
Albumin: 4.5 g/dL (ref 3.8–4.8)
Alkaline Phosphatase: 78 IU/L (ref 47–123)
BUN/Creatinine Ratio: 9 — ABNORMAL LOW (ref 10–24)
BUN: 13 mg/dL (ref 8–27)
Bilirubin Total: 0.6 mg/dL (ref 0.0–1.2)
CO2: 24 mmol/L (ref 20–29)
Calcium: 9.6 mg/dL (ref 8.6–10.2)
Chloride: 99 mmol/L (ref 96–106)
Creatinine, Ser: 1.37 mg/dL — ABNORMAL HIGH (ref 0.76–1.27)
Globulin, Total: 2.5 g/dL (ref 1.5–4.5)
Glucose: 108 mg/dL — ABNORMAL HIGH (ref 70–99)
Potassium: 4.4 mmol/L (ref 3.5–5.2)
Sodium: 140 mmol/L (ref 134–144)
Total Protein: 7 g/dL (ref 6.0–8.5)
eGFR: 54 mL/min/1.73 — ABNORMAL LOW (ref >59)

## 2024-09-15 ENCOUNTER — Encounter: Payer: Self-pay | Admitting: Internal Medicine

## 2024-09-15 ENCOUNTER — Ambulatory Visit: Admitting: Internal Medicine

## 2024-09-15 VITALS — BP 144/70 | HR 64 | Ht 70.0 in | Wt 195.0 lb

## 2024-09-15 DIAGNOSIS — E782 Mixed hyperlipidemia: Secondary | ICD-10-CM

## 2024-09-15 DIAGNOSIS — R030 Elevated blood-pressure reading, without diagnosis of hypertension: Secondary | ICD-10-CM | POA: Diagnosis not present

## 2024-09-15 DIAGNOSIS — J439 Emphysema, unspecified: Secondary | ICD-10-CM

## 2024-09-15 DIAGNOSIS — J302 Other seasonal allergic rhinitis: Secondary | ICD-10-CM

## 2024-09-15 DIAGNOSIS — N1831 Chronic kidney disease, stage 3a: Secondary | ICD-10-CM | POA: Diagnosis not present

## 2024-09-15 DIAGNOSIS — Z125 Encounter for screening for malignant neoplasm of prostate: Secondary | ICD-10-CM

## 2024-09-15 DIAGNOSIS — R7303 Prediabetes: Secondary | ICD-10-CM

## 2024-09-15 DIAGNOSIS — K760 Fatty (change of) liver, not elsewhere classified: Secondary | ICD-10-CM | POA: Diagnosis not present

## 2024-09-15 MED ORDER — ROSUVASTATIN CALCIUM 10 MG PO TABS: 20.0000 mg | ORAL_TABLET | Freq: Every evening | ORAL | 3 refills | Status: AC

## 2024-09-15 NOTE — Progress Notes (Signed)
 "  Established Patient Office Visit  Subjective:  Patient ID: Jonathan Dean, male    DOB: Aug 10, 1951  Age: 73 y.o. MRN: [id number]  CC:  Chief Complaint  Patient presents with   Hyperlipidemia    Six month follow up    COPD    Six month follow up    HPI Jonathan Dean is a 73 y.o. male with past medical history of COPD, hyperlipidemia, glaucoma and colon polyps who presents for f/u of his chronic medical conditions.  COPD: His breathing has been better with Breztri .  He has not required albuterol  inhaler recently.  He denies any chest pain or palpitations.  He denies any fever, chills, sore throat, nasal congestion or postnasal drip.  He continues to take OTC antihistaminic.  He has stopped taking Singulair  due to concern of nightmares.  Fatty liver: He was found to have fatty liver disease on US  of abdomen.  His liver enzymes were elevated previously, but have improved now.  He reports daily alcohol intake in the past, but only 1 drink in a sitting. He has cut down and does not take alcohol daily now.  Denies any binge drinking.  Denies any abdominal pain, nausea or vomiting.  HLD: His  LDL cholesterol has worsened to 127 now from 190 in 12/24. He is tolerating Crestor  10 mg well. He has tried to cut back on fried food.    Past Medical History:  Diagnosis Date   ERECTILE DYSFUNCTION 06/30/2009   Qualifier: Diagnosis of  By: Scarlet MD, Elsie Peels    Hyperlipidemia    Polyp of colon, adenomatous 09/15/2011   Colonoscopy 11/07/11 removed at least two adenomatous polyps confirmed by surgical path.      Past Surgical History:  Procedure Laterality Date   COLONOSCOPY N/A 01/20/2021   Procedure: COLONOSCOPY;  Surgeon: Golda Claudis PENNER, MD;  Location: [facility];  Service: Endoscopy;  Laterality: N/A;  am   POLYPECTOMY  01/20/2021   Procedure: POLYPECTOMY;  Surgeon: Golda Claudis PENNER, MD;  Location: [facility];  Service: Endoscopy;;    History reviewed. No pertinent  family history.  Social History   Socioeconomic History   Marital status: Married    Spouse name: Not on file   Number of children: Not on file   Years of education: Not on file   Highest education level: 12th grade  Occupational History   Not on file  Tobacco Use   Smoking status: Former    Current packs/day: 0.00    Average packs/day: 1.5 packs/day for 25.0 years (37.5 ttl pk-yrs)    Types: Cigarettes    Start date: 09/14/1980    Quit date: 09/14/2005    Years since quitting: 19.0   Smokeless tobacco: Never  Vaping Use   Vaping status: Never Used  Substance and Sexual Activity   Alcohol use: Yes    Alcohol/week: 2.0 standard drinks of alcohol    Types: 2 Standard drinks or equivalent per week   Drug use: No   Sexual activity: Yes    Partners: Female    Comment: monogamous  Other Topics Concern   Not on file  Social History Narrative   Not on file   Social Drivers of Health   Tobacco Use: Medium Risk (09/15/2024)   Patient History    Smoking Tobacco Use: Former    Smokeless Tobacco Use: Never    Passive Exposure: Not on file  Financial Resource Strain: Low Risk (01/29/2024)  Overall Financial Resource Strain (CARDIA)    Difficulty of Paying Living Expenses: Not hard at all  Recent Concern: Financial Resource Strain - Medium Risk (11/26/2023)   Overall Financial Resource Strain (CARDIA)    Difficulty of Paying Living Expenses: Somewhat hard  Food Insecurity: No Food Insecurity (01/29/2024)   Hunger Vital Sign    Worried About Running Out of Food in the Last Year: Never true    Ran Out of Food in the Last Year: Never true  Transportation Needs: No Transportation Needs (01/29/2024)   PRAPARE - Administrator, Civil Service (Medical): No    Lack of Transportation (Non-Medical): No  Physical Activity: Sufficiently Active (01/29/2024)   Exercise Vital Sign    Days of Exercise per Week: 7 days    Minutes of Exercise per Session: 30 min  Stress: No Stress  Concern Present (01/29/2024)   [employer] of Occupational Health - Occupational Stress Questionnaire    Feeling of Stress : Not at all  Social Connections: Moderately Integrated (01/29/2024)   Social Connection and Isolation Panel    Frequency of Communication with Friends and Family: More than three times a week    Frequency of Social Gatherings with Friends and Family: More than three times a week    Attends Religious Services: More than 4 times per year    Active Member of [employer] or Organizations: No    Attends Banker Meetings: Never    Marital Status: Married  Recent Concern: Social Connections - Moderately Isolated (11/26/2023)   Social Connection and Isolation Panel    Frequency of Communication with Friends and Family: Twice a week    Frequency of Social Gatherings with Friends and Family: More than three times a week    Attends Religious Services: Never    Database Administrator or Organizations: No    Attends Engineer, Structural: Not on file    Marital Status: Married  Catering Manager Violence: Not At Risk (01/29/2024)   Humiliation, Afraid, Rape, and Kick questionnaire    Fear of Current or Ex-Partner: No    Emotionally Abused: No    Physically Abused: No    Sexually Abused: No  Depression (PHQ2-9): Low Risk (09/15/2024)   Depression (PHQ2-9)    PHQ-2 Score: 0  Alcohol Screen: Low Risk (01/29/2024)   Alcohol Screen    Last Alcohol Screening Score (AUDIT): 0  Housing: Low Risk (01/29/2024)   Housing Stability Vital Sign    Unable to Pay for Housing in the Last Year: No    Number of Times Moved in the Last Year: 0    Homeless in the Last Year: No  Utilities: Not At Risk (01/29/2024)   [facility] Utilities    Threatened with loss of utilities: No  Health Literacy: Adequate Health Literacy (01/29/2024)   B1300 Health Literacy    Frequency of need for help with medical instructions: Never    Outpatient Medications Prior to Visit  Medication Sig Dispense  Refill   albuterol  (VENTOLIN  HFA) 108 (90 Base) MCG/ACT inhaler Inhale 2 puffs into the lungs every 6 hours as needed for wheezing or shortness of breath. 8.5 g 2   budesonide-glycopyrrolate-formoterol (BREZTRI  AEROSPHERE) 160-9-4.8 MCG/ACT AERO inhaler Inhale 2 puffs into the lungs 2 (two) times daily. 3 each 3   dorzolamide-timolol (COSOPT) 22.3-6.8 MG/ML ophthalmic solution Place 1 drop into both eyes 2 (two) times daily.     guaiFENesin  200 MG tablet Take 1 tablet (200 mg total) by  mouth every 4 (four) hours as needed for cough or to loosen phlegm. 30 tablet 2   latanoprost (XALATAN) 0.005 % ophthalmic solution Place 1 drop into both eyes at bedtime.     Multiple Vitamins-Minerals (MULTIVITAMIN WITH MINERALS) tablet Take 1 tablet by mouth as needed.     montelukast  (SINGULAIR ) 10 MG tablet TAKE (1) TABLET BY MOUTH AT BEDTIME. 90 tablet 3   rosuvastatin  (CRESTOR ) 10 MG tablet Take 1 tablet (10 mg total) by mouth every evening. 90 tablet 1   No facility-administered medications prior to visit.    Allergies  Allergen Reactions   Dorzolamide Hcl-Timolol Mal Other (See Comments)    Swelling of eye lids only the [employer] brand of cosopt drug store is aware of this problem    ROS Review of Systems  Constitutional:  Negative for chills and fever.  HENT:  Negative for congestion and sore throat.   Eyes:  Negative for pain and discharge.  Respiratory:  Negative for cough and shortness of breath.   Cardiovascular:  Negative for chest pain and palpitations.  Gastrointestinal:  Negative for constipation, diarrhea, nausea and vomiting.  Endocrine: Negative for polydipsia and polyuria.  Genitourinary:  Negative for dysuria and hematuria.  Musculoskeletal:  Negative for neck pain and neck stiffness.  Skin:  Negative for rash.  Neurological:  Negative for dizziness, weakness, numbness and headaches.  Psychiatric/Behavioral:  Negative for agitation and behavioral problems.       Objective:     Physical Exam Vitals reviewed.  Constitutional:      General: He is not in acute distress.    Appearance: He is not diaphoretic.  HENT:     Head: Normocephalic and atraumatic.     Nose: Nose normal.     Mouth/Throat:     Mouth: Mucous membranes are moist.  Eyes:     General: No scleral icterus.    Extraocular Movements: Extraocular movements intact.  Cardiovascular:     Rate and Rhythm: Normal rate and regular rhythm.     Heart sounds: Normal heart sounds. No murmur heard. Pulmonary:     Breath sounds: Normal breath sounds. No wheezing or rales.  Musculoskeletal:     Cervical back: Neck supple. No tenderness.     Right lower leg: No edema.     Left lower leg: No edema.  Skin:    General: Skin is warm.     Findings: No rash.  Neurological:     General: No focal deficit present.     Mental Status: He is alert and oriented to person, place, and time.     Sensory: No sensory deficit.     Motor: No weakness.  Psychiatric:        Mood and Affect: Mood normal.        Behavior: Behavior normal.     BP (!) 144/70 (BP Location: Left Arm)   Pulse 64   Ht 5' 10 (1.778 m)   Wt 195 lb (88.5 kg)   SpO2 98%   BMI 27.98 kg/m  Wt Readings from Last 3 Encounters:  09/15/24 195 lb (88.5 kg)  03/17/24 194 lb 12.8 oz (88.4 kg)  01/29/24 194 lb (88 kg)    Lab Results  Component Value Date   TSH 3.400 03/14/2024   Lab Results  Component Value Date   WBC 4.6 03/14/2024   HGB 13.6 03/14/2024   HCT 41.8 03/14/2024   MCV 98 (H) 03/14/2024   PLT 216 03/14/2024   Lab  Results  Component Value Date   NA 140 09/10/2024   K 4.4 09/10/2024   CO2 24 09/10/2024   GLUCOSE 108 (H) 09/10/2024   BUN 13 09/10/2024   CREATININE 1.37 (H) 09/10/2024   BILITOT 0.6 09/10/2024   ALKPHOS 78 09/10/2024   AST 29 09/10/2024   ALT 31 09/10/2024   PROT 7.0 09/10/2024   ALBUMIN 4.5 09/10/2024   CALCIUM  9.6 09/10/2024   ANIONGAP 11 12/13/2020   EGFR 54 (L) 09/10/2024   Lab Results   Component Value Date   CHOL 211 (H) 09/10/2024   Lab Results  Component Value Date   HDL 42 09/10/2024   Lab Results  Component Value Date   LDLCALC 127 (H) 09/10/2024   Lab Results  Component Value Date   TRIG 234 (H) 09/10/2024   Lab Results  Component Value Date   CHOLHDL 5.0 09/10/2024   Lab Results  Component Value Date   HGBA1C 5.8 (H) 03/14/2024      Assessment & Plan:   Problem List Items Addressed This Visit       Respiratory   COPD (chronic obstructive pulmonary disease) (HCC) - Primary   Well-controlled with Breztri  now Albuterol  as needed Continue Cetirizine for allergies DC Singulair  as he had nightmares with it        Digestive   Metabolic dysfunction-associated fatty liver disease (MAFLD)   Has had elevated liver enzymes in the past, now better His alcohol consumption is not significant currently and he has decreased alcohol intake Advised to follow low-carb diet      Relevant Orders   CMP14+EGFR     Genitourinary   CKD (chronic kidney disease) stage 3, GFR 30-59 ml/min (HCC)   Last 2 CMP showed GFR was 59 and 54 Recheck CMP before next visit Advised to maintain adequate hydration and follow low sodium diet Avoid nephrotoxic agents      Relevant Orders   VITAMIN D  25 Hydroxy (Vit-D Deficiency, Fractures)   CMP14+EGFR   CBC with Differential/Platelet     Other   Prostate cancer screening   Ordered PSA after discussing its limitations for prostate cancer screening, including false positive results leading to additional investigations.      Relevant Orders   PSA   Seasonal allergies   Continue OTC Zyrtec Continue Nasacort, has not required it recently      Mixed hyperlipidemia   On Crestor  10 mg QD Lipid profile had shown significant improvement in LDL initially, but elevated again Increased dose of Crestor  to 20 mg QD Advised to follow low cholesterol diet for now No h/o DM, CVA or CAD      Relevant Medications    rosuvastatin  (CRESTOR ) 10 MG tablet   Other Relevant Orders   Lipid panel   Prediabetes   Lab Results  Component Value Date   HGBA1C 5.8 (H) 03/14/2024   Advised to follow low-carb diet      Relevant Orders   Hemoglobin A1c   CMP14+EGFR   Elevated BP without diagnosis of hypertension   BP Readings from Last 1 Encounters:  09/15/24 (!) 144/70   Elevated today, reports he had 2 cups of coffee and grits with added salt Advised DASH diet and moderate exercise/walking, at least 150 mins/week Advised to check BP at home and contact with BP readings from home after 2 weeks If persistently elevated, plan to start amlodipine      Relevant Orders   TSH   CMP14+EGFR   CBC with Differential/Platelet  Meds ordered this encounter  Medications   rosuvastatin  (CRESTOR ) 10 MG tablet    Sig: Take 2 tablets (20 mg total) by mouth every evening.    Dispense:  90 tablet    Refill:  3    Follow-up: Return in about 6 months (around 03/16/2025) for COPD (after 03/18/25).    Suzzane MARLA Blanch, MD "

## 2024-09-15 NOTE — Assessment & Plan Note (Addendum)
 On Crestor  10 mg QD Lipid profile had shown significant improvement in LDL initially, but elevated again Increased dose of Crestor  to 20 mg QD Advised to follow low cholesterol diet for now No h/o DM, CVA or CAD

## 2024-09-15 NOTE — Assessment & Plan Note (Signed)
 BP Readings from Last 1 Encounters:  09/15/24 (!) 144/70   Elevated today, reports he had 2 cups of coffee and grits with added salt Advised DASH diet and moderate exercise/walking, at least 150 mins/week Advised to check BP at home and contact with BP readings from home after 2 weeks If persistently elevated, plan to start amlodipine

## 2024-09-15 NOTE — Patient Instructions (Addendum)
 Please start taking Crestor  20 mg once daily.  Please continue to take medications as prescribed.  Please continue to follow low salt diet and perform moderate exercise/walking at least 150 mins/week.

## 2024-09-15 NOTE — Assessment & Plan Note (Signed)
 Continue OTC Zyrtec Continue Nasacort, has not required it recently

## 2024-09-15 NOTE — Assessment & Plan Note (Signed)
 Ordered PSA after discussing its limitations for prostate cancer screening, including false positive results leading to additional investigations.

## 2024-09-15 NOTE — Assessment & Plan Note (Signed)
 Has had elevated liver enzymes in the past, now better His alcohol consumption is not significant currently and he has decreased alcohol intake Advised to follow low-carb diet

## 2024-09-15 NOTE — Assessment & Plan Note (Signed)
 Last 2 CMP showed GFR was 59 and 54 Recheck CMP before next visit Advised to maintain adequate hydration and follow low sodium diet Avoid nephrotoxic agents

## 2024-09-15 NOTE — Assessment & Plan Note (Addendum)
 Well-controlled with Breztri  now Albuterol  as needed Continue Cetirizine for allergies DC Singulair  as he had nightmares with it

## 2024-09-15 NOTE — Assessment & Plan Note (Signed)
 Lab Results  Component Value Date   HGBA1C 5.8 (H) 03/14/2024   Advised to follow low-carb diet

## 2024-09-28 ENCOUNTER — Encounter: Payer: Self-pay | Admitting: Internal Medicine

## 2024-10-02 ENCOUNTER — Other Ambulatory Visit: Payer: Self-pay | Admitting: Internal Medicine

## 2025-02-02 ENCOUNTER — Ambulatory Visit

## 2025-02-03 ENCOUNTER — Ambulatory Visit

## 2025-03-16 ENCOUNTER — Ambulatory Visit: Payer: Self-pay | Admitting: Internal Medicine
